# Patient Record
Sex: Female | Born: 2000 | Race: Black or African American | Hispanic: No | Marital: Single | Smoking: Former smoker
Health system: Southern US, Community
[De-identification: ages and names within clinical notes are randomized; demographics above are authoritative.]

## PROBLEM LIST (undated history)

## (undated) ENCOUNTER — Inpatient Hospital Stay (HOSPITAL_COMMUNITY): Payer: Self-pay

## (undated) DIAGNOSIS — D649 Anemia, unspecified: Secondary | ICD-10-CM

## (undated) DIAGNOSIS — A549 Gonococcal infection, unspecified: Secondary | ICD-10-CM

## (undated) HISTORY — DX: Anemia, unspecified: D64.9

## (undated) HISTORY — PX: NO PAST SURGERIES: SHX2092

## (undated) HISTORY — DX: Gonococcal infection, unspecified: A54.9

---
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
## 2019-07-27 ENCOUNTER — Ambulatory Visit (HOSPITAL_COMMUNITY)
Admission: EM | Admit: 2019-07-27 | Discharge: 2019-07-27 | Disposition: A | Discharge status: Home / Self Care | Payer: Medicaid Other | Attending: Family Medicine | Admitting: Family Medicine

## 2019-07-27 ENCOUNTER — Ambulatory Visit (INDEPENDENT_AMBULATORY_CARE_PROVIDER_SITE_OTHER): Payer: Medicaid Other

## 2019-07-27 ENCOUNTER — Encounter (HOSPITAL_COMMUNITY): Payer: Self-pay | Admitting: Emergency Medicine

## 2019-07-27 ENCOUNTER — Other Ambulatory Visit: Payer: Self-pay

## 2019-07-27 DIAGNOSIS — M79644 Pain in right finger(s): Secondary | ICD-10-CM | POA: Diagnosis not present

## 2019-07-27 DIAGNOSIS — S92811A Other fracture of right foot, initial encounter for closed fracture: Secondary | ICD-10-CM

## 2019-07-27 DIAGNOSIS — M7989 Other specified soft tissue disorders: Secondary | ICD-10-CM | POA: Diagnosis not present

## 2019-07-27 DIAGNOSIS — M79671 Pain in right foot: Secondary | ICD-10-CM

## 2019-07-27 MED ORDER — CYCLOBENZAPRINE HCL 10 MG PO TABS: 10.0000 mg | ORAL_TABLET | Freq: Two times a day (BID) | ORAL | 0 refills | Status: DC | PRN

## 2019-07-27 MED ORDER — IBUPROFEN 600 MG PO TABS: 600.0000 mg | ORAL_TABLET | Freq: Four times a day (QID) | ORAL | 0 refills | Status: DC | PRN
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
## 2019-07-27 NOTE — Discharge Instructions (Addendum)
The x-ray of your thumb shows that no bones are broken, no bony abnormalities are noted.  The x-ray of your foot shows a possible fracture to the bottom of your foot just under your big toe.  I have gotten crutches and a postop shoe for you to wear.  I would like you to follow-up with orthopedics this week.  [facility] is listed on this paperwork for you to call or to go in for an appointment.  You may use ice, ibuprofen, and elevate your foot through tonight.  Follow-up in the ER for loss of sensation, loss of strength in the right leg, other concerning symptoms.
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
## 2019-07-29 DIAGNOSIS — M79674 Pain in right toe(s): Secondary | ICD-10-CM | POA: Insufficient documentation

## 2019-10-28 DIAGNOSIS — S93529A Sprain of metatarsophalangeal joint of unspecified toe(s), initial encounter: Secondary | ICD-10-CM | POA: Insufficient documentation

## 2019-10-28 HISTORY — DX: Sprain of metatarsophalangeal joint of unspecified toe(s), initial encounter: S93.529A

## 2019-10-31 ENCOUNTER — Other Ambulatory Visit (HOSPITAL_COMMUNITY): Payer: Self-pay | Admitting: Orthopedic Surgery

## 2019-11-03 ENCOUNTER — Encounter (HOSPITAL_BASED_OUTPATIENT_CLINIC_OR_DEPARTMENT_OTHER): Payer: Self-pay | Admitting: Orthopedic Surgery

## 2019-11-03 ENCOUNTER — Other Ambulatory Visit: Payer: Self-pay

## 2019-11-07 ENCOUNTER — Other Ambulatory Visit (HOSPITAL_COMMUNITY)
Admission: RE | Admit: 2019-11-07 | Discharge: 2019-11-07 | Disposition: A | Discharge status: Home / Self Care | Payer: Medicaid Other | Source: Ambulatory Visit | Attending: Orthopedic Surgery | Admitting: Orthopedic Surgery

## 2019-11-07 DIAGNOSIS — Z20822 Contact with and (suspected) exposure to covid-19: Secondary | ICD-10-CM | POA: Insufficient documentation

## 2019-11-07 DIAGNOSIS — Z01812 Encounter for preprocedural laboratory examination: Secondary | ICD-10-CM | POA: Diagnosis present

## 2019-11-07 LAB — SARS CORONAVIRUS 2 (TAT 6-24 HRS): SARS Coronavirus 2: NEGATIVE

## 2019-11-09 NOTE — Progress Notes (Signed)

## 2019-11-10 ENCOUNTER — Ambulatory Visit (HOSPITAL_BASED_OUTPATIENT_CLINIC_OR_DEPARTMENT_OTHER): Payer: Medicaid Other | Admitting: Anesthesiology

## 2019-11-10 ENCOUNTER — Encounter (HOSPITAL_BASED_OUTPATIENT_CLINIC_OR_DEPARTMENT_OTHER): Payer: Self-pay | Admitting: Orthopedic Surgery

## 2019-11-10 ENCOUNTER — Encounter (HOSPITAL_BASED_OUTPATIENT_CLINIC_OR_DEPARTMENT_OTHER)
Admission: RE | Disposition: A | Discharge status: Home / Self Care | Payer: Self-pay | Source: Home / Self Care | Attending: Orthopedic Surgery

## 2019-11-10 ENCOUNTER — Ambulatory Visit (HOSPITAL_BASED_OUTPATIENT_CLINIC_OR_DEPARTMENT_OTHER)
Admission: RE | Admit: 2019-11-10 | Discharge: 2019-11-10 | Disposition: A | Discharge status: Home / Self Care | Payer: Medicaid Other | Attending: Orthopedic Surgery | Admitting: Orthopedic Surgery

## 2019-11-10 ENCOUNTER — Other Ambulatory Visit: Payer: Self-pay

## 2019-11-10 DIAGNOSIS — F17210 Nicotine dependence, cigarettes, uncomplicated: Secondary | ICD-10-CM | POA: Diagnosis not present

## 2019-11-10 DIAGNOSIS — G8929 Other chronic pain: Secondary | ICD-10-CM | POA: Insufficient documentation

## 2019-11-10 DIAGNOSIS — S99921A Unspecified injury of right foot, initial encounter: Secondary | ICD-10-CM | POA: Insufficient documentation

## 2019-11-10 HISTORY — PX: SESAMOIDECTOMY: SHX6418

## 2019-11-10 LAB — POCT PREGNANCY, URINE: Preg Test, Ur: NEGATIVE

## 2019-11-10 SURGERY — EXCISION, SESAMOID BONE
Anesthesia: Regional | Site: Foot | Laterality: Right

## 2019-11-10 MED ORDER — DEXAMETHASONE SODIUM PHOSPHATE 10 MG/ML IJ SOLN
INTRAMUSCULAR | Status: DC | PRN
  Administered 2019-11-10: 5 mg via INTRAVENOUS

## 2019-11-10 MED ORDER — SODIUM CHLORIDE 0.9 % IV SOLN: INTRAVENOUS | Status: DC

## 2019-11-10 MED ORDER — CEFAZOLIN SODIUM-DEXTROSE 2-4 GM/100ML-% IV SOLN
INTRAVENOUS | Status: AC
  Filled 2019-11-10: qty 100

## 2019-11-10 MED ORDER — PROPOFOL 10 MG/ML IV BOLUS
INTRAVENOUS | Status: DC | PRN
  Administered 2019-11-10: 150 mg via INTRAVENOUS

## 2019-11-10 MED ORDER — FENTANYL CITRATE (PF) 100 MCG/2ML IJ SOLN
100.0000 ug | Freq: Once | INTRAMUSCULAR | Status: AC
  Administered 2019-11-10: 50 ug via INTRAVENOUS

## 2019-11-10 MED ORDER — ACETAMINOPHEN 10 MG/ML IV SOLN: 1000.0000 mg | Freq: Once | INTRAVENOUS | Status: DC | PRN

## 2019-11-10 MED ORDER — OXYCODONE HCL 5 MG PO TABS: 5.0000 mg | ORAL_TABLET | Freq: Once | ORAL | Status: DC | PRN

## 2019-11-10 MED ORDER — MIDAZOLAM HCL 2 MG/2ML IJ SOLN
2.0000 mg | Freq: Once | INTRAMUSCULAR | Status: AC
  Administered 2019-11-10: 2 mg via INTRAVENOUS

## 2019-11-10 MED ORDER — FENTANYL CITRATE (PF) 100 MCG/2ML IJ SOLN
INTRAMUSCULAR | Status: AC
  Filled 2019-11-10: qty 2

## 2019-11-10 MED ORDER — ONDANSETRON HCL 4 MG/2ML IJ SOLN
INTRAMUSCULAR | Status: AC
  Filled 2019-11-10: qty 2

## 2019-11-10 MED ORDER — PHENYLEPHRINE 40 MCG/ML (10ML) SYRINGE FOR IV PUSH (FOR BLOOD PRESSURE SUPPORT)
PREFILLED_SYRINGE | INTRAVENOUS | Status: AC
  Filled 2019-11-10: qty 10

## 2019-11-10 MED ORDER — LACTATED RINGERS IV SOLN: INTRAVENOUS | Status: DC

## 2019-11-10 MED ORDER — ACETAMINOPHEN 160 MG/5ML PO SOLN: 1000.0000 mg | Freq: Once | ORAL | Status: DC | PRN

## 2019-11-10 MED ORDER — DIPHENHYDRAMINE HCL 50 MG/ML IJ SOLN
INTRAMUSCULAR | Status: AC
  Filled 2019-11-10: qty 1

## 2019-11-10 MED ORDER — FENTANYL CITRATE (PF) 100 MCG/2ML IJ SOLN
INTRAMUSCULAR | Status: DC | PRN
  Administered 2019-11-10: 50 ug via INTRAVENOUS
  Administered 2019-11-10 (×2): 25 ug via INTRAVENOUS

## 2019-11-10 MED ORDER — MIDAZOLAM HCL 2 MG/2ML IJ SOLN
INTRAMUSCULAR | Status: AC
  Filled 2019-11-10: qty 2

## 2019-11-10 MED ORDER — MIDAZOLAM HCL 5 MG/5ML IJ SOLN
INTRAMUSCULAR | Status: DC | PRN
  Administered 2019-11-10: 1 mg via INTRAVENOUS

## 2019-11-10 MED ORDER — LIDOCAINE 2% (20 MG/ML) 5 ML SYRINGE
INTRAMUSCULAR | Status: AC
  Filled 2019-11-10: qty 5

## 2019-11-10 MED ORDER — OXYCODONE HCL 5 MG/5ML PO SOLN: 5.0000 mg | Freq: Once | ORAL | Status: DC | PRN

## 2019-11-10 MED ORDER — DIPHENHYDRAMINE HCL 50 MG/ML IJ SOLN
INTRAMUSCULAR | Status: DC | PRN
  Administered 2019-11-10: 6.25 mg via INTRAVENOUS

## 2019-11-10 MED ORDER — DEXAMETHASONE SODIUM PHOSPHATE 10 MG/ML IJ SOLN
INTRAMUSCULAR | Status: AC
  Filled 2019-11-10: qty 1

## 2019-11-10 MED ORDER — FENTANYL CITRATE (PF) 100 MCG/2ML IJ SOLN: 25.0000 ug | INTRAMUSCULAR | Status: DC | PRN

## 2019-11-10 MED ORDER — ACETAMINOPHEN 500 MG PO TABS: 1000.0000 mg | ORAL_TABLET | Freq: Once | ORAL | Status: DC | PRN

## 2019-11-10 MED ORDER — ONDANSETRON HCL 4 MG/2ML IJ SOLN
INTRAMUSCULAR | Status: DC | PRN
  Administered 2019-11-10: 4 mg via INTRAVENOUS

## 2019-11-10 MED ORDER — CEFAZOLIN SODIUM-DEXTROSE 2-4 GM/100ML-% IV SOLN
2.0000 g | INTRAVENOUS | Status: AC
  Administered 2019-11-10: 2 g via INTRAVENOUS

## 2019-11-10 MED ORDER — SUCCINYLCHOLINE CHLORIDE 200 MG/10ML IV SOSY
PREFILLED_SYRINGE | INTRAVENOUS | Status: AC
  Filled 2019-11-10: qty 10

## 2019-11-10 MED ORDER — BUPIVACAINE-EPINEPHRINE (PF) 0.5% -1:200000 IJ SOLN
INTRAMUSCULAR | Status: DC | PRN
Start: 2019-11-10 — End: 2019-11-10
  Administered 2019-11-10: 30 mL via PERINEURAL

## 2019-11-10 MED ORDER — LIDOCAINE HCL (CARDIAC) PF 100 MG/5ML IV SOSY
PREFILLED_SYRINGE | INTRAVENOUS | Status: DC | PRN
  Administered 2019-11-10: 40 mg via INTRAVENOUS

## 2019-11-10 MED ORDER — EPHEDRINE 5 MG/ML INJ
INTRAVENOUS | Status: AC
  Filled 2019-11-10: qty 10

## 2019-11-10 MED ORDER — 0.9 % SODIUM CHLORIDE (POUR BTL) OPTIME
TOPICAL | Status: DC | PRN
  Administered 2019-11-10: 400 mL

## 2019-11-10 MED ORDER — OXYCODONE HCL 5 MG PO TABS
5.0000 mg | ORAL_TABLET | Freq: Four times a day (QID) | ORAL | 0 refills | Status: AC | PRN
Start: 2019-11-10 — End: 2019-11-15

## 2019-11-10 SURGICAL SUPPLY — 69 items
ANCH SUT 2-0 3/8 CRC MIC FT 4 (Anchor) ×2 IMPLANT
ANCHOR FT CORKSCREW MICRO 2-0 (Anchor) ×4 IMPLANT
APL PRP STRL LF DISP 70% ISPRP (MISCELLANEOUS) ×1
BANDAGE ESMARK 6X9 LF (GAUZE/BANDAGES/DRESSINGS) ×1 IMPLANT
BLADE MINI RND TIP GREEN BEAV (BLADE) ×1 IMPLANT
BLADE SURG 15 STRL LF DISP TIS (BLADE) ×2 IMPLANT
BLADE SURG 15 STRL SS (BLADE) ×6
BNDG CMPR 9X4 STRL LF SNTH (GAUZE/BANDAGES/DRESSINGS)
BNDG CMPR 9X6 STRL LF SNTH (GAUZE/BANDAGES/DRESSINGS)
BNDG COHESIVE 4X5 TAN STRL (GAUZE/BANDAGES/DRESSINGS) ×2 IMPLANT
BNDG COHESIVE 6X5 TAN STRL LF (GAUZE/BANDAGES/DRESSINGS) ×2 IMPLANT
BNDG CONFORM 3 STRL LF (GAUZE/BANDAGES/DRESSINGS) ×2 IMPLANT
BNDG ELASTIC 4X5.8 VLCR STR LF (GAUZE/BANDAGES/DRESSINGS) ×2 IMPLANT
BNDG ESMARK 4X9 LF (GAUZE/BANDAGES/DRESSINGS) IMPLANT
BNDG ESMARK 6X9 LF (GAUZE/BANDAGES/DRESSINGS)
CHLORAPREP W/TINT 26 (MISCELLANEOUS) ×3 IMPLANT
CORD BIPOLAR FORCEPS 12FT (ELECTRODE) IMPLANT
COVER BACK TABLE 60X90IN (DRAPES) ×3 IMPLANT
COVER WAND RF STERILE (DRAPES) IMPLANT
CUFF TOURN SGL QUICK 24 (TOURNIQUET CUFF)
CUFF TOURN SGL QUICK 34 (TOURNIQUET CUFF) ×3
CUFF TRNQT CYL 24X4X16.5-23 (TOURNIQUET CUFF) IMPLANT
CUFF TRNQT CYL 34X4.125X (TOURNIQUET CUFF) IMPLANT
DRAPE EXTREMITY T 121X128X90 (DISPOSABLE) ×3 IMPLANT
DRAPE OEC MINIVIEW 54X84 (DRAPES) IMPLANT
DRAPE U-SHAPE 47X51 STRL (DRAPES) ×3 IMPLANT
DRSG MEPITEL 4X7.2 (GAUZE/BANDAGES/DRESSINGS) ×3 IMPLANT
DRSG PAD ABDOMINAL 8X10 ST (GAUZE/BANDAGES/DRESSINGS) ×3 IMPLANT
ELECT REM PT RETURN 9FT ADLT (ELECTROSURGICAL) ×3
ELECTRODE REM PT RTRN 9FT ADLT (ELECTROSURGICAL) IMPLANT
GAUZE SPONGE 4X4 12PLY STRL (GAUZE/BANDAGES/DRESSINGS) ×3 IMPLANT
GLOVE BIO SURGEON STRL SZ8 (GLOVE) ×3 IMPLANT
GLOVE BIOGEL PI IND STRL 7.0 (GLOVE) IMPLANT
GLOVE BIOGEL PI IND STRL 8 (GLOVE) ×2 IMPLANT
GLOVE BIOGEL PI INDICATOR 7.0 (GLOVE) ×4
GLOVE BIOGEL PI INDICATOR 8 (GLOVE) ×4
GLOVE ECLIPSE 6.5 STRL STRAW (GLOVE) ×2 IMPLANT
GLOVE ECLIPSE 8.0 STRL XLNG CF (GLOVE) ×3 IMPLANT
GOWN STRL REUS W/ TWL LRG LVL3 (GOWN DISPOSABLE) ×1 IMPLANT
GOWN STRL REUS W/ TWL XL LVL3 (GOWN DISPOSABLE) ×2 IMPLANT
GOWN STRL REUS W/TWL LRG LVL3 (GOWN DISPOSABLE) ×3
GOWN STRL REUS W/TWL XL LVL3 (GOWN DISPOSABLE) ×3
LOOP VESSEL MAXI BLUE (MISCELLANEOUS) ×2 IMPLANT
NDL HYPO 25X1 1.5 SAFETY (NEEDLE) IMPLANT
NEEDLE HYPO 22GX1.5 SAFETY (NEEDLE) IMPLANT
NEEDLE HYPO 25X1 1.5 SAFETY (NEEDLE) IMPLANT
NS IRRIG 1000ML POUR BTL (IV SOLUTION) ×3 IMPLANT
PACK BASIN DAY SURGERY FS (CUSTOM PROCEDURE TRAY) ×3 IMPLANT
PAD CAST 4YDX4 CTTN HI CHSV (CAST SUPPLIES) ×1 IMPLANT
PADDING CAST COTTON 4X4 STRL (CAST SUPPLIES) ×3
PADDING CAST COTTON 6X4 STRL (CAST SUPPLIES) ×2 IMPLANT
PENCIL SMOKE EVACUATOR (MISCELLANEOUS) ×2 IMPLANT
SANITIZER HAND PURELL 535ML FO (MISCELLANEOUS) ×3 IMPLANT
SHEET MEDIUM DRAPE 40X70 STRL (DRAPES) ×3 IMPLANT
SLEEVE SCD COMPRESS KNEE MED (MISCELLANEOUS) ×3 IMPLANT
SPLINT FAST PLASTER 5X30 (CAST SUPPLIES) ×40
SPLINT PLASTER CAST FAST 5X30 (CAST SUPPLIES) IMPLANT
SPONGE LAP 18X18 RF (DISPOSABLE) ×3 IMPLANT
STOCKINETTE 6  STRL (DRAPES) ×3
STOCKINETTE 6 STRL (DRAPES) ×1 IMPLANT
SUT ETHILON 3 0 PS 1 (SUTURE) ×3 IMPLANT
SUT MNCRL AB 3-0 PS2 18 (SUTURE) ×3 IMPLANT
SUT VIC AB 0 SH 27 (SUTURE) ×2 IMPLANT
SUT VIC AB 2-0 SH 27 (SUTURE)
SUT VIC AB 2-0 SH 27XBRD (SUTURE) IMPLANT
SYR BULB EAR ULCER 3OZ GRN STR (SYRINGE) ×3 IMPLANT
SYR CONTROL 10ML LL (SYRINGE) IMPLANT
TOWEL GREEN STERILE FF (TOWEL DISPOSABLE) ×3 IMPLANT
UNDERPAD 30X36 HEAVY ABSORB (UNDERPADS AND DIAPERS) ×3 IMPLANT

## 2019-11-10 NOTE — Anesthesia Preprocedure Evaluation (Signed)
Anesthesia Evaluation  Patient identified by MRN, date of birth, ID band Patient awake    Reviewed: Allergy & Precautions, NPO status , Patient's Chart, lab work & pertinent test results  Airway Mallampati: I  TM Distance: >3 FB Neck ROM: Full    Dental  (+) Dental Advisory Given, Teeth Intact,    Pulmonary neg recent URI, Current Smoker and Patient abstained from smoking.,    breath sounds clear to auscultation       Cardiovascular negative cardio ROS   Rhythm:Regular     Neuro/Psych negative neurological ROS  negative psych ROS   GI/Hepatic negative GI ROS, Neg liver ROS,   Endo/Other  negative endocrine ROS  Renal/GU negative Renal ROS     Musculoskeletal Right plantar plate injury   Abdominal   Peds  Hematology negative hematology ROS (+)   Anesthesia Other Findings   Reproductive/Obstetrics                             Anesthesia Physical Anesthesia Plan  ASA: II  Anesthesia Plan: General and Regional   Post-op Pain Management:  Regional for Post-op pain   Induction: Intravenous  PONV Risk Score and Plan: 2 and Ondansetron and Dexamethasone  Airway Management Planned: LMA  Additional Equipment: None  Intra-op Plan:   Post-operative Plan: Extubation in OR  Informed Consent: I have reviewed the patients History and Physical, chart, labs and discussed the procedure including the risks, benefits and alternatives for the proposed anesthesia with the patient or authorized representative who has indicated his/her understanding and acceptance.     Dental advisory given  Plan Discussed with: CRNA and Surgeon  Anesthesia Plan Comments:         Anesthesia Quick Evaluation

## 2019-11-10 NOTE — Transfer of Care (Signed)
Immediate Anesthesia Transfer of Care Note  Patient: Stacey Woods  Procedure(s) Performed: Right plantar plate repair (Right Foot)  Patient Location: PACU  Anesthesia Type:GA combined with regional for post-op pain  Level of Consciousness: awake, alert , oriented and drowsy  Airway & Oxygen Therapy: Patient Spontanous Breathing and Patient connected to face mask oxygen  Post-op Assessment: Report given to RN and Post -op Vital signs reviewed and stable  Post vital signs: Reviewed and stable  Last Vitals:  Vitals Value Taken Time  BP    Temp    Pulse 85 11/10/19 1347  Resp 18 11/10/19 1347  SpO2 100 % 11/10/19 1347  Vitals shown include unvalidated device data.  Last Pain:  Vitals:   11/10/19 1026  TempSrc: Oral  PainSc: 0-No pain      Patients Stated Pain Goal: 3 (11/10/19 1026)  Complications: No complications documented.

## 2019-11-10 NOTE — Anesthesia Postprocedure Evaluation (Signed)
Anesthesia Post Note  Patient: Stacey Woods  Procedure(s) Performed: Right plantar plate repair (Right Foot)     Patient location during evaluation: PACU Anesthesia Type: Regional and General Level of consciousness: awake and alert Pain management: pain level controlled Vital Signs Assessment: post-procedure vital signs reviewed and stable Respiratory status: spontaneous breathing, nonlabored ventilation, respiratory function stable and patient connected to nasal cannula oxygen Cardiovascular status: blood pressure returned to baseline and stable Postop Assessment: no apparent nausea or vomiting Anesthetic complications: no   No complications documented.  Last Vitals:  Vitals:   11/10/19 1415 11/10/19 1445  BP:  (!) 132/83  Pulse: 59 57  Resp: 14 16  Temp:  36.5 C  SpO2: 100% 100%    Last Pain:  Vitals:   11/10/19 1445  TempSrc:   PainSc: 0-No pain                 Nava Song

## 2019-11-10 NOTE — Anesthesia Procedure Notes (Signed)
Procedure Name: LMA Insertion Date/Time: 11/10/2019 12:15 PM Performed by: Ronnette Hila, CRNA Pre-anesthesia Checklist: Patient identified, Emergency Drugs available, Suction available and Patient being monitored Patient Re-evaluated:Patient Re-evaluated prior to induction Oxygen Delivery Method: Circle system utilized Preoxygenation: Pre-oxygenation with 100% oxygen Induction Type: IV induction Ventilation: Mask ventilation without difficulty LMA: LMA inserted LMA Size: 4.0 Number of attempts: 1 Airway Equipment and Method: Bite block Placement Confirmation: positive ETCO2 Tube secured with: Tape Dental Injury: Teeth and Oropharynx as per pre-operative assessment
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
## 2019-11-10 NOTE — Progress Notes (Signed)
Assisted Dr. Moser with right, ultrasound guided, popliteal block. Side rails up, monitors on throughout procedure. See vital signs in flow sheet. Tolerated Procedure well. 

## 2019-11-10 NOTE — Anesthesia Procedure Notes (Signed)
Anesthesia Regional Block: Popliteal block   Pre-Anesthetic Checklist: ,, timeout performed, Correct Patient, Correct Site, Correct Laterality, Correct Procedure, Correct Position, site marked, Risks and benefits discussed,  Surgical consent,  Pre-op evaluation,  At surgeon's request and post-op pain management  Laterality: Right and Lower  Prep: chloraprep       Needles:  Injection technique: Single-shot     Needle Length: 9cm  Needle Gauge: 22     Additional Needles: Arrow StimuQuik ECHO Echogenic Stimulating PNB Needle  Procedures:,,,, ultrasound used (permanent image in chart),,,,  Narrative:  Start time: 11/10/2019 11:34 AM End time: 11/10/2019 11:39 AM Injection made incrementally with aspirations every 5 mL.  Performed by: Personally  Anesthesiologist: Val Eagle, MD

## 2019-11-10 NOTE — Discharge Instructions (Addendum)
Toni Arthurs, MD [facility]  Please read the following information regarding your care after surgery.  Medications  You only need a prescription for the narcotic pain medicine (ex. oxycodone, Percocet, Norco).  All of the other medicines listed below are available over the counter. X Aleve 2 pills twice a day for the first 3 days after surgery. X acetominophen (Tylenol) 650 mg every 4-6 hours as you need for minor to moderate pain X oxycodone as prescribed for severe pain  Narcotic pain medicine (ex. oxycodone, Percocet, Vicodin) will cause constipation.  To prevent this problem, take the following medicines while you are taking any pain medicine. X docusate sodium (Colace) 100 mg twice a day x senna (Senokot) 2 tablets twice a day  X To help prevent blood clots, take a baby aspirin (81 mg) twice a day for two weeks after surgery.  You should also get up every hour while you are awake to move around.    Weight Bearing ? Bear weight when you are able on your operated leg or foot. ? Bear weight only on your operated foot in the post-op shoe. X Do not bear any weight on the operated leg or foot.  Cast / Splint / Dressing X Keep your splint, cast or dressing clean and dry.  Don't put anything (coat hanger, pencil, etc) down inside of it.  If it gets damp, use a hair dryer on the cool setting to dry it.  If it gets soaked, call the office to schedule an appointment for a cast change. ? Remove your dressing 3 days after surgery and cover the incisions with dry dressings.    After your dressing, cast or splint is removed; you may shower, but do not soak or scrub the wound.  Allow the water to run over it, and then gently pat it dry.  Swelling It is normal for you to have swelling where you had surgery.  To reduce swelling and pain, keep your toes above your nose for at least 3 days after surgery.  It may be necessary to keep your foot or leg elevated for several weeks.  If it hurts, it should be  elevated.  Follow Up Call my office at [phone] when you are discharged from the hospital or surgery center to schedule an appointment to be seen two weeks after surgery.  Call my office at [phone] if you develop a fever >101.5 F, nausea, vomiting, bleeding from the surgical site or severe pain.     Post Anesthesia Home Care Instructions  Activity: Get plenty of rest for the remainder of the day. A responsible individual must stay with you for 24 hours following the procedure.  For the next 24 hours, DO NOT: -Drive a car -Advertising copywriter -Drink alcoholic beverages -Take any medication unless instructed by your physician -Make any legal decisions or sign important papers.  Meals: Start with liquid foods such as gelatin or soup. Progress to regular foods as tolerated. Avoid greasy, spicy, heavy foods. If nausea and/or vomiting occur, drink only clear liquids until the nausea and/or vomiting subsides. Call your physician if vomiting continues.  Special Instructions/Symptoms: Your throat may feel dry or sore from the anesthesia or the breathing tube placed in your throat during surgery. If this causes discomfort, gargle with warm salt water. The discomfort should disappear within 24 hours.  If you had a scopolamine patch placed behind your ear for the management of post- operative nausea and/or vomiting:  1. The medication in the patch is  effective for 72 hours, after which it should be removed.  Wrap patch in a tissue and discard in the trash. Wash hands thoroughly with soap and water. 2. You may remove the patch earlier than 72 hours if you experience unpleasant side effects which may include dry mouth, dizziness or visual disturbances. 3. Avoid touching the patch. Wash your hands with soap and water after contact with the patch.    Regional Anesthesia Blocks  1. Numbness or the inability to move the "blocked" extremity may last from 3-48 hours after placement. The length  of time depends on the medication injected and your individual response to the medication. If the numbness is not going away after 48 hours, call your surgeon.  2. The extremity that is blocked will need to be protected until the numbness is gone and the  Strength has returned. Because you cannot feel it, you will need to take extra care to avoid injury. Because it may be weak, you may have difficulty moving it or using it. You may not know what position it is in without looking at it while the block is in effect.  3. For blocks in the legs and feet, returning to weight bearing and walking needs to be done carefully. You will need to wait until the numbness is entirely gone and the strength has returned. You should be able to move your leg and foot normally before you try and bear weight or walk. You will need someone to be with you when you first try to ensure you do not fall and possibly risk injury.  4. Bruising and tenderness at the needle site are common side effects and will resolve in a few days.  5. Persistent numbness or new problems with movement should be communicated to the surgeon or the [facility] [phone] [facility] [phone]).
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
## 2019-11-15 ENCOUNTER — Encounter (HOSPITAL_BASED_OUTPATIENT_CLINIC_OR_DEPARTMENT_OTHER): Payer: Self-pay | Admitting: Orthopedic Surgery

## 2019-12-07 ENCOUNTER — Other Ambulatory Visit: Payer: Self-pay

## 2019-12-07 ENCOUNTER — Ambulatory Visit
Admission: EM | Admit: 2019-12-07 | Discharge: 2019-12-07 | Disposition: A | Discharge status: Home / Self Care | Payer: Medicaid Other | Attending: Physician Assistant | Admitting: Physician Assistant

## 2019-12-07 DIAGNOSIS — Z202 Contact with and (suspected) exposure to infections with a predominantly sexual mode of transmission: Secondary | ICD-10-CM | POA: Diagnosis present

## 2019-12-07 DIAGNOSIS — N3090 Cystitis, unspecified without hematuria: Secondary | ICD-10-CM | POA: Insufficient documentation

## 2019-12-07 LAB — POCT URINALYSIS DIP (MANUAL ENTRY)
Bilirubin, UA: NEGATIVE
Glucose, UA: NEGATIVE mg/dL
Ketones, POC UA: NEGATIVE mg/dL
Nitrite, UA: NEGATIVE
Protein Ur, POC: NEGATIVE mg/dL
Spec Grav, UA: 1.02 (ref 1.010–1.025)
Urobilinogen, UA: 0.2 E.U./dL
pH, UA: 6.5 (ref 5.0–8.0)

## 2019-12-07 LAB — POCT URINE PREGNANCY: Preg Test, Ur: NEGATIVE

## 2019-12-07 MED ORDER — CEPHALEXIN 500 MG PO CAPS
500.0000 mg | ORAL_CAPSULE | Freq: Two times a day (BID) | ORAL | 0 refills | Status: DC
Start: 2019-12-07 — End: 2020-08-19
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
## 2019-12-07 NOTE — Discharge Instructions (Signed)
Your urine was positive for an urinary tract infection. Start keflex as directed. Cytology sent, you will be contacted with any positive results that requires further treatment. Refrain from sexual activity until testing results return. Monitor for any worsening of symptoms, fever, abdominal pain, nausea, vomiting, go to the emergency department for further evaluation.

## 2019-12-07 NOTE — ED Provider Notes (Signed)
[facility]    CSN: [id number] Arrival date & time: 12/07/19  1254      History   Chief Complaint Chief Complaint  Patient presents with  . Dysuria  . Exposure to STD    HPI Stacey Woods is a 19 y.o. female.   19 year old female comes in for 2 day history of urinary frequency, urgency. Denies dysuria, hematuria.  Denies vaginal discharge, itching, spotting. Denies fever, chills, body aches. Denies abdominal pain, nausea, vomiting. LMP 11/25/2019. Sexually active one female partner. States was told to "get checked "for STD", but unsure actual exposure.      History reviewed. No pertinent past medical history.  There are no problems to display for this patient.   Past Surgical History:  Procedure Laterality Date  . NO PAST SURGERIES    . SESAMOIDECTOMY Right 11/10/2019   Procedure: Right plantar plate repair;  Surgeon: Toni Arthurs, MD;  Location: [facility];  Service: Orthopedics;  Laterality: Right;    OB History   No obstetric history on file.      Home Medications    Prior to Admission medications   Medication Sig Start Date End Date Taking? Authorizing Provider  aspirin 81 MG chewable tablet Chew by mouth daily.   Yes [provider]  HYDROcodone-acetaminophen (NORCO/VICODIN) 5-325 MG tablet hydrocodone 5 mg-acetaminophen 325 mg tablet  TAKE 1 TABLET BY MOUTH EVERY 4 HOURS AS NEEDED FOR SEVERE PAIN   Yes [provider]  cephALEXin (KEFLEX) 500 MG capsule Take 1 capsule (500 mg total) by mouth 2 (two) times daily. 12/07/19   Belinda Fisher, PA-C    Family History History reviewed. No pertinent family history.  Social History Social History   Tobacco Use  . Smoking status: Current Some Day Smoker    Types: Cigarettes  . Smokeless tobacco: Never Used  . Tobacco comment: stopped smoking a week ago   Substance Use Topics  . Alcohol use: Never  . Drug use: Never     Allergies   Patient has no known  allergies.   Review of Systems Review of Systems  Reason unable to perform ROS: See HPI as above.     Physical Exam Triage Vital Signs ED Triage Vitals  Enc Vitals Group     BP 12/07/19 1447 114/79     Pulse Rate 12/07/19 1447 93     Resp 12/07/19 1447 16     Temp 12/07/19 1447 98.7 F (37.1 C)     Temp Source 12/07/19 1447 Oral     SpO2 12/07/19 1447 98 %     Weight --      Height --      Head Circumference --      Peak Flow --      Pain Score 12/07/19 1501 0     Pain Loc --      Pain Edu? --      Excl. in GC? --    No data found.  Updated Vital Signs BP 114/79 (BP Location: Left Arm)   Pulse 93   Temp 98.7 F (37.1 C) (Oral)   Resp 16   LMP 11/25/2019   SpO2 98%   Physical Exam Constitutional:      General: She is not in acute distress.    Appearance: She is well-developed. She is not ill-appearing, toxic-appearing or diaphoretic.  HENT:     Head: Normocephalic and atraumatic.  Eyes:     Conjunctiva/sclera: Conjunctivae normal.  Pupils: Pupils are equal, round, and reactive to light.  Cardiovascular:     Rate and Rhythm: Normal rate and regular rhythm.  Pulmonary:     Effort: Pulmonary effort is normal. No respiratory distress.     Comments: LCTAB Abdominal:     General: Bowel sounds are normal.     Palpations: Abdomen is soft.     Tenderness: There is no abdominal tenderness. There is no right CVA tenderness, left CVA tenderness, guarding or rebound.  Musculoskeletal:     Cervical back: Normal range of motion and neck supple.  Skin:    General: Skin is warm and dry.  Neurological:     Mental Status: She is alert and oriented to person, place, and time.  Psychiatric:        Behavior: Behavior normal.        Judgment: Judgment normal.      UC Treatments / Results  Labs (all labs ordered are listed, but only abnormal results are displayed) Labs Reviewed  POCT URINALYSIS DIP (MANUAL ENTRY) - Abnormal; Notable for the following components:       Result Value   Blood, UA small (*)    Leukocytes, UA Small (1+) (*)    All other components within normal limits  URINE CULTURE  POCT URINE PREGNANCY  CERVICOVAGINAL ANCILLARY ONLY    EKG   Radiology No results found.  Procedures Procedures (including critical care time)  Medications Ordered in UC Medications - No data to display  Initial Impression / Assessment and Plan / UC Course  I have reviewed the triage vital signs and the nursing notes.  Pertinent labs & imaging results that were available during my care of the patient were reviewed by me and considered in my medical decision making (see chart for details).    Urine dipstick positive for UTI. Start keflex as directed. Cytology sent, patient to refrain from sexual activity until testing results return. Push fluids. Return precautions given.  Final Clinical Impressions(s) / UC Diagnoses   Final diagnoses:  Cystitis  Potential exposure to STD   ED Prescriptions    Medication Sig Dispense Auth. Provider   cephALEXin (KEFLEX) 500 MG capsule Take 1 capsule (500 mg total) by mouth 2 (two) times daily. 10 capsule Belinda Fisher, PA-C     PDMP not reviewed this encounter.   Belinda Fisher, PA-C 12/07/19 1524

## 2019-12-09 LAB — CERVICOVAGINAL ANCILLARY ONLY
Bacterial Vaginitis (gardnerella): POSITIVE — AB
Candida Glabrata: NEGATIVE
Candida Vaginitis: NEGATIVE
Chlamydia: NEGATIVE
Comment: NEGATIVE
Comment: NEGATIVE
Comment: NEGATIVE
Comment: NEGATIVE
Comment: NEGATIVE
Comment: NORMAL
Neisseria Gonorrhea: POSITIVE — AB
Trichomonas: POSITIVE — AB

## 2019-12-10 ENCOUNTER — Other Ambulatory Visit: Payer: Self-pay

## 2019-12-10 ENCOUNTER — Encounter: Payer: Self-pay | Admitting: Emergency Medicine

## 2019-12-10 ENCOUNTER — Telehealth: Payer: Self-pay | Admitting: Emergency Medicine

## 2019-12-10 ENCOUNTER — Ambulatory Visit
Admission: EM | Admit: 2019-12-10 | Discharge: 2019-12-10 | Disposition: A | Discharge status: Home / Self Care | Payer: Medicaid Other | Attending: Emergency Medicine | Admitting: Emergency Medicine

## 2019-12-10 DIAGNOSIS — A549 Gonococcal infection, unspecified: Secondary | ICD-10-CM | POA: Diagnosis not present

## 2019-12-10 LAB — URINE CULTURE: Culture: >=100000 — AB

## 2019-12-10 MED ORDER — CEFTRIAXONE SODIUM 500 MG IJ SOLR
500.0000 mg | Freq: Once | INTRAMUSCULAR | Status: AC
  Administered 2019-12-10: 500 mg via INTRAMUSCULAR

## 2019-12-10 MED ORDER — METRONIDAZOLE 500 MG PO TABS
500.0000 mg | ORAL_TABLET | Freq: Two times a day (BID) | ORAL | 0 refills | Status: DC
Start: 2019-12-10 — End: 2020-08-19

## 2019-12-10 MED ORDER — FLUCONAZOLE 150 MG PO TABS
150.0000 mg | ORAL_TABLET | Freq: Every day | ORAL | 0 refills | Status: DC
Start: 2019-12-10 — End: 2020-08-19

## 2019-12-10 NOTE — ED Triage Notes (Signed)
Pt here for STD treatment; pt given RX for BV and trichomonas and given IM rocephin for [facility]

## 2020-04-12 ENCOUNTER — Emergency Department (HOSPITAL_COMMUNITY)
Admission: EM | Admit: 2020-04-12 | Discharge: 2020-04-12 | Disposition: A | Discharge status: Home / Self Care | Payer: Medicaid Other | Attending: Student | Admitting: Student

## 2020-04-12 ENCOUNTER — Encounter (HOSPITAL_COMMUNITY): Payer: Self-pay | Admitting: Emergency Medicine

## 2020-04-12 DIAGNOSIS — F17210 Nicotine dependence, cigarettes, uncomplicated: Secondary | ICD-10-CM | POA: Diagnosis not present

## 2020-04-12 DIAGNOSIS — Z7982 Long term (current) use of aspirin: Secondary | ICD-10-CM | POA: Diagnosis not present

## 2020-04-12 DIAGNOSIS — Z20822 Contact with and (suspected) exposure to covid-19: Secondary | ICD-10-CM | POA: Diagnosis present

## 2020-04-12 LAB — RESP PANEL BY RT-PCR (FLU A&B, COVID) ARPGX2
Influenza A by PCR: NEGATIVE
Influenza B by PCR: NEGATIVE
SARS Coronavirus 2 by RT PCR: NEGATIVE

## 2020-04-12 NOTE — ED Provider Notes (Signed)
[facility] EMERGENCY DEPARTMENT Provider Note   CSN: [id number] Arrival date & time: 04/12/20  2876     History Chief Complaint  Patient presents with  . Covid Exposure    Stacey Woods is a 19 y.o. female with no significant past medical history presents to the ED requesting a Covid test.  Patient states she had a positive Covid exposure 2 days ago.  Denies any symptoms.  Denies chest pain, shortness of breath, fever, chills, sore throat, abdominal pain, nausea, vomiting, diarrhea.  No treatment prior to arrival.  No aggravating or alleviating factors.  History obtained from patient and past medical records. No interpreter used during encounter.      History reviewed. No pertinent past medical history.  There are no problems to display for this patient.   Past Surgical History:  Procedure Laterality Date  . NO PAST SURGERIES    . SESAMOIDECTOMY Right 11/10/2019   Procedure: Right plantar plate repair;  Surgeon: Toni Arthurs, MD;  Location: [facility];  Service: Orthopedics;  Laterality: Right;     OB History   No obstetric history on file.     No family history on file.  Social History   Tobacco Use  . Smoking status: Current Some Day Smoker    Types: Cigarettes  . Smokeless tobacco: Never Used  . Tobacco comment: stopped smoking a week ago   Substance Use Topics  . Alcohol use: Never  . Drug use: Never    Home Medications Prior to Admission medications   Medication Sig Start Date End Date Taking? Authorizing Provider  aspirin 81 MG chewable tablet Chew by mouth daily.    [provider]  cephALEXin (KEFLEX) 500 MG capsule Take 1 capsule (500 mg total) by mouth 2 (two) times daily. 12/07/19   Cathie Hoops, Amy V, PA-C  fluconazole (DIFLUCAN) 150 MG tablet Take 1 tablet (150 mg total) by mouth daily. 12/10/19   Hall-Potvin, [place], PA-C  HYDROcodone-acetaminophen (NORCO/VICODIN) 5-325 MG tablet hydrocodone 5 mg-acetaminophen  325 mg tablet  TAKE 1 TABLET BY MOUTH EVERY 4 HOURS AS NEEDED FOR SEVERE PAIN    [provider]  metroNIDAZOLE (FLAGYL) 500 MG tablet Take 1 tablet (500 mg total) by mouth 2 (two) times daily. 12/10/19   Hall-Potvin, [place], PA-C    Allergies    Patient has no known allergies.  Review of Systems   Review of Systems  Constitutional: Negative for chills and fever.  HENT: Negative for sore throat.   Respiratory: Negative for shortness of breath.   Cardiovascular: Negative for chest pain.  Gastrointestinal: Negative for diarrhea, nausea and vomiting.  All other systems reviewed and are negative.   Physical Exam Updated Vital Signs BP 119/73   Pulse 79   Temp 98.1 F (36.7 C) (Oral)   Resp 16   Ht 5\' 4"  (1.626 m)   SpO2 100%   BMI 24.90 kg/m   Physical Exam Vitals and nursing note reviewed.  Constitutional:      General: She is not in acute distress.    Appearance: She is not ill-appearing.  HENT:     Head: Normocephalic.  Eyes:     Pupils: Pupils are equal, round, and reactive to light.  Cardiovascular:     Rate and Rhythm: Normal rate and regular rhythm.     Pulses: Normal pulses.     Heart sounds: Normal heart sounds. No murmur heard. No friction rub. No gallop.   Pulmonary:  Effort: Pulmonary effort is normal.     Breath sounds: Normal breath sounds.     Comments: Respirations equal and unlabored, patient able to speak in full sentences, lungs clear to auscultation bilaterally Abdominal:     General: Abdomen is flat. There is no distension.     Palpations: Abdomen is soft.     Tenderness: There is no abdominal tenderness. There is no guarding or rebound.  Musculoskeletal:        General: Normal range of motion.     Cervical back: Neck supple.  Skin:    General: Skin is warm and dry.  Neurological:     General: No focal deficit present.     Mental Status: She is alert.  Psychiatric:        Mood and Affect: Mood normal.        Behavior:  Behavior normal.     ED Results / Procedures / Treatments   Labs (all labs ordered are listed, but only abnormal results are displayed) Labs Reviewed  RESP PANEL BY RT-PCR (FLU A&B, COVID) ARPGX2    EKG None  Radiology No results found.  Procedures Procedures (including critical care time)  Medications Ordered in ED Medications - No data to display  ED Course  I have reviewed the triage vital signs and the nursing notes.  Pertinent labs & imaging results that were available during my care of the patient were reviewed by me and considered in my medical decision making (see chart for details).    MDM Rules/Calculators/A&P                         19 year old female presents to the ED requesting a Covid test after a positive exposure 2 days ago.  Patient is currently asymptomatic.  Vitals all within normal limits.  Lungs clear to auscultation bilaterally.  Low suspicion for pneumonia.  Covid test pending.  Self quarantine guidelines discussed with patient. Strict ED precautions discussed with patient. Patient states understanding and agrees to plan. Patient discharged home in no acute distress and stable vitals  Final Clinical Impression(s) / ED Diagnoses Final diagnoses:  Exposure to COVID-19 virus    Rx / DC Orders ED Discharge Orders    None       Jesusita Oka 04/12/20 1133    Alvira Monday, MD 04/12/20 [phone]

## 2020-04-12 NOTE — ED Triage Notes (Signed)
Pt reports she was around someone 2 days ago that tested pos for covid yesterday. She denies any symptoms.

## 2020-04-12 NOTE — Discharge Instructions (Addendum)
Your Covid results will become available within the next 2 hours.  Continue to self quarantine until your results are available.  If your Covid test is positive you must self quarantine away from others for 10 days since symptom onset.  You may take over-the-counter ibuprofen or Tylenol as needed for fever and body aches.  Return to the ER for new or worsening symptoms.

## 2020-04-14 NOTE — L&D Delivery Note (Signed)
Delivery Note Called to attend this delivery d/t rapid progression and Dr. Claiborne Billings being in the OR.  After 1 push, at 8:09 AM a viable female was delivered via  (Presentation:  LOA  ).  APGAR: 9, 9; weight pending.  After 1 minute, the cord was clamped and cut. 40 units of pitocin diluted in 1000cc LR was infused rapidly IV.  The placenta separated spontaneously and delivered via CCT and maternal pushing effort.  It was inspected and appears to be intact with a 3 VC. Bleeding was heavy despite pitocin, of cytotec PR, massage. Jada inserted per manufacture's instructions, balloon inflated w/120cc H20.  Dr. Adrian Blackwater in room at this point.  Bleeding immediately resolved.  Dr. Adrian Blackwater updated Dr. Claiborne Billings.    Anesthesia: Epidural Episiotomy:   Lacerations:  none Suture Repair:  Est. Blood Loss (mL):  719  Mom to postpartum.  Baby to Couplet care / Skin to Skin.  Jacklyn Shell 03/26/2021, 8:33 AM

## 2020-04-17 ENCOUNTER — Other Ambulatory Visit: Payer: Self-pay

## 2020-04-17 ENCOUNTER — Emergency Department (HOSPITAL_COMMUNITY)
Admission: EM | Admit: 2020-04-17 | Discharge: 2020-04-17 | Disposition: A | Discharge status: Home / Self Care | Payer: Medicaid Other | Attending: Emergency Medicine | Admitting: Emergency Medicine

## 2020-04-17 ENCOUNTER — Encounter (HOSPITAL_COMMUNITY): Payer: Self-pay | Admitting: Emergency Medicine

## 2020-04-17 DIAGNOSIS — U071 COVID-19: Secondary | ICD-10-CM | POA: Diagnosis not present

## 2020-04-17 DIAGNOSIS — M791 Myalgia, unspecified site: Secondary | ICD-10-CM | POA: Diagnosis present

## 2020-04-17 DIAGNOSIS — F17210 Nicotine dependence, cigarettes, uncomplicated: Secondary | ICD-10-CM | POA: Insufficient documentation

## 2020-04-17 LAB — RESP PANEL BY RT-PCR (RSV, FLU A&B, COVID)  RVPGX2
Influenza A by PCR: NEGATIVE
Influenza B by PCR: NEGATIVE
Resp Syncytial Virus by PCR: NEGATIVE
SARS Coronavirus 2 by RT PCR: POSITIVE — AB

## 2020-04-17 MED ORDER — ONDANSETRON HCL 4 MG PO TABS: 4.0000 mg | ORAL_TABLET | Freq: Four times a day (QID) | ORAL | 0 refills | Status: AC

## 2020-04-17 MED ORDER — ACETAMINOPHEN 325 MG PO TABS
650.0000 mg | ORAL_TABLET | Freq: Once | ORAL | Status: AC
  Administered 2020-04-17: 650 mg via ORAL
  Filled 2020-04-17: qty 2

## 2020-04-17 NOTE — Discharge Instructions (Signed)
You tested positive for COVID-19 on today's visit. I have also provided medication to help with nausea, please take this as needed.   I have provided a work note, you should quarantine for the next 7 days.  Please continue to take Tylenol to help with your fever, continue to hydrate plenty of fluids.

## 2020-04-17 NOTE — ED Provider Notes (Signed)
Stacey Woods   CSN: [id number] Arrival date & time: 04/17/20  1024     History Chief Complaint  Patient presents with  . covid symptoms    Stacey Woods is a 20 y.o. female.  20 y.o female with no PMH presents to the ED with a chief complaint of body aches, sore throat, URI symptoms times yesterday.  Patient was recently tested for COVID-19 exactly 4 days ago after an exposure and tested negative.  On today's visit, Stacey Woods returns with a fever with a T-max of 101.2, has taken Tylenol to break her symptoms without any improvement.  Stacey Woods was retested on today's visit, tested positive. Patient also endorses body aches, rhinorrhea which Stacey Woods has taken tylenol for. No chest pain, no shortness of breath, no cough. Not on any supplemental estrogen.   The history is provided by the patient.       History reviewed. No pertinent past medical history.  There are no problems to display for this patient.   Past Surgical History:  Procedure Laterality Date  . NO PAST SURGERIES    . SESAMOIDECTOMY Right 11/10/2019   Procedure: Right plantar plate repair;  Surgeon: Toni Arthurs, MD;  Location: [facility];  Service: Orthopedics;  Laterality: Right;     OB History   No obstetric history on file.     No family history on file.  Social History   Tobacco Use  . Smoking status: Current Some Day Smoker    Types: Cigarettes  . Smokeless tobacco: Never Used  . Tobacco comment: stopped smoking a week ago   Substance Use Topics  . Alcohol use: Never  . Drug use: Never    Home Medications Prior to Admission medications   Medication Sig Start Date End Date Taking? Authorizing Provider  aspirin 81 MG chewable tablet Chew by mouth daily.    [provider]  cephALEXin (KEFLEX) 500 MG capsule Take 1 capsule (500 mg total) by mouth 2 (two) times daily. 12/07/19   Cathie Hoops, Amy V, PA-C  fluconazole (DIFLUCAN) 150 MG tablet Take  1 tablet (150 mg total) by mouth daily. 12/10/19   Hall-Potvin, [place], PA-C  HYDROcodone-acetaminophen (NORCO/VICODIN) 5-325 MG tablet hydrocodone 5 mg-acetaminophen 325 mg tablet  TAKE 1 TABLET BY MOUTH EVERY 4 HOURS AS NEEDED FOR SEVERE PAIN    [provider]  metroNIDAZOLE (FLAGYL) 500 MG tablet Take 1 tablet (500 mg total) by mouth 2 (two) times daily. 12/10/19   Hall-Potvin, [place], PA-C    Allergies    Patient has no known allergies.  Review of Systems   Review of Systems  Constitutional: Positive for fever.  HENT: Positive for rhinorrhea.   Musculoskeletal: Positive for myalgias.    Physical Exam Updated Vital Signs BP 108/81   Pulse (!) 105   Temp 99.3 F (37.4 C) (Oral)   Resp 18   Ht 5\' 10"  (1.778 m)   SpO2 100%   BMI 20.81 kg/m   Physical Exam Vitals and nursing Woods reviewed.  Constitutional:      Appearance: Stacey Woods is ill-appearing.  HENT:     Head: Normocephalic and atraumatic.     Mouth/Throat:     Mouth: Mucous membranes are dry.  Cardiovascular:     Rate and Rhythm: Normal rate.  Pulmonary:     Effort: Pulmonary effort is normal.     Breath sounds: No wheezing or rales.     Comments: No wheezing, rhonchi  Or rales.  Abdominal:     General: Abdomen is flat.  Musculoskeletal:     Cervical back: Normal range of motion and neck supple.  Skin:    General: Skin is warm and dry.  Neurological:     Mental Status: Stacey Woods is alert and oriented to person, place, and time.     ED Results / Procedures / Treatments   Labs (all labs ordered are listed, but only abnormal results are displayed) Labs Reviewed  RESP PANEL BY RT-PCR (RSV, FLU A&B, COVID)  RVPGX2 - Abnormal; Notable for the following components:      Result Value   SARS Coronavirus 2 by RT PCR POSITIVE (*)    All other components within normal limits    EKG None  Radiology No results found.  Procedures Procedures (including critical care time)  Medications Ordered in  ED Medications  acetaminophen (TYLENOL) tablet 650 mg (650 mg Oral Given 04/17/20 1122)    ED Course  I have reviewed the triage vital signs and the nursing notes.  Pertinent labs & imaging results that were available during my care of the patient were reviewed by me and considered in my medical decision making (see chart for details).    MDM Rules/Calculators/A&P    Patient here with URI symptoms consistent with fever, cough, rhinorrhea.  Tested negative for COVID-19 5 days ago.  Stacey Woods returns today as symptoms have worsened, including body aches, fever with a T-max of 101.5, has been taking Tylenol without much improvement in her symptoms.  Stacey Woods was positive for COVID-19 infection on today's visit.  Stacey Woods was ambulated personally by me, no hypoxia, no tachycardia, remained with oxygen saturations above 97%.  Temperature was rechecked and 99.3, Stacey Woods was asked to to continue care at home with symptomatic treatment.  Patient understands and agrees with management, return precautions discussed at length.   TERAH ROBEY was evaluated in Emergency Department on 04/17/2020 for the symptoms described in the history of present illness. Stacey Woods was evaluated in the context of the global COVID-19 pandemic, which necessitated consideration that the patient might be at risk for infection with the SARS-CoV-2 virus that causes COVID-19. Institutional protocols and algorithms that pertain to the evaluation of patients at risk for COVID-19 are in a state of rapid change based on information released by regulatory bodies including the CDC and federal and state organizations. These policies and algorithms were followed during the patient's care in the ED.   Portions of this Woods were generated with Scientist, clinical (histocompatibility and immunogenetics). Dictation errors may occur despite best attempts at proofreading.  Final Clinical Impression(s) / ED Diagnoses Final diagnoses:  COVID-19 virus infection    Rx / DC Orders ED Discharge Orders     None       Claude Manges, PA-C 04/17/20 1407    Mancel Bale, MD 04/18/20 [phone]

## 2020-04-17 NOTE — ED Triage Notes (Signed)
Here on 30th after exposure, neg covid test-- now is sick with fever, congestion, sneezing, coughing, body aches.Marland Kitchen

## 2020-07-16 ENCOUNTER — Ambulatory Visit (INDEPENDENT_AMBULATORY_CARE_PROVIDER_SITE_OTHER): Payer: Medicaid Other | Admitting: Primary Care

## 2020-08-19 ENCOUNTER — Inpatient Hospital Stay (HOSPITAL_COMMUNITY)
Admission: AD | Admit: 2020-08-19 | Discharge: 2020-08-19 | Disposition: A | Discharge status: Home / Self Care | Payer: BC Managed Care – PPO | Attending: Obstetrics & Gynecology | Admitting: Obstetrics & Gynecology

## 2020-08-19 ENCOUNTER — Encounter (HOSPITAL_COMMUNITY): Payer: Self-pay | Admitting: Obstetrics & Gynecology

## 2020-08-19 ENCOUNTER — Other Ambulatory Visit: Payer: Self-pay

## 2020-08-19 DIAGNOSIS — O219 Vomiting of pregnancy, unspecified: Secondary | ICD-10-CM | POA: Diagnosis not present

## 2020-08-19 DIAGNOSIS — R110 Nausea: Secondary | ICD-10-CM | POA: Insufficient documentation

## 2020-08-19 DIAGNOSIS — O26899 Other specified pregnancy related conditions, unspecified trimester: Secondary | ICD-10-CM | POA: Diagnosis not present

## 2020-08-19 DIAGNOSIS — Z3A00 Weeks of gestation of pregnancy not specified: Secondary | ICD-10-CM

## 2020-08-19 DIAGNOSIS — Z3201 Encounter for pregnancy test, result positive: Secondary | ICD-10-CM | POA: Insufficient documentation

## 2020-08-19 LAB — POCT PREGNANCY, URINE: Preg Test, Ur: POSITIVE — AB

## 2020-08-19 MED ORDER — FAMOTIDINE 20 MG PO TABS
20.0000 mg | ORAL_TABLET | Freq: Two times a day (BID) | ORAL | 0 refills | Status: DC
Start: 2020-08-19 — End: 2021-03-11

## 2020-08-19 MED ORDER — PROMETHAZINE HCL 25 MG PO TABS
25.0000 mg | ORAL_TABLET | Freq: Four times a day (QID) | ORAL | 1 refills | Status: DC | PRN
Start: 2020-08-19 — End: 2021-03-11

## 2020-08-19 MED ORDER — ONDANSETRON 4 MG PO TBDP
4.0000 mg | ORAL_TABLET | Freq: Three times a day (TID) | ORAL | 1 refills | Status: DC | PRN
Start: 2020-08-19 — End: 2021-03-11

## 2020-08-19 NOTE — MAU Provider Note (Signed)
Event Date/Time   First Provider Initiated Contact with Patient 08/19/20 1149      S Ms. Stacey Woods is a 20 y.o. G1P0 patient who presents to MAU today with complaint of nausea. She reports since she found out she is pregnant, she has been very nauseous. She does not vomit. She denies any abdominal pain, vaginal bleeding or discharge.    O BP 121/74 (BP Location: Right Arm)   Pulse 63   Temp 98.2 F (36.8 C) (Oral)   Resp 15   Ht 5\' 4"  (1.626 m)   Wt 64.3 kg   LMP 07/05/2020   SpO2 100%   BMI 24.32 kg/m  Physical Exam Vitals and nursing note reviewed.  Constitutional:      General: She is not in acute distress.    Appearance: She is well-developed.  HENT:     Head: Normocephalic.  Eyes:     Pupils: Pupils are equal, round, and reactive to light.  Cardiovascular:     Rate and Rhythm: Normal rate.  Pulmonary:     Effort: Pulmonary effort is normal. No respiratory distress.  Skin:    General: Skin is warm and dry.  Neurological:     Mental Status: She is alert and oriented to person, place, and time.  Psychiatric:        Behavior: Behavior normal.        Thought Content: Thought content normal.        Judgment: Judgment normal.     A Medical screening exam complete 1. Positive pregnancy test   2. Pregnancy related nausea, antepartum    Patient requesting RX for nausea medication and does not want to wait for UA or medication while in MAU.  P -Discharge home in stable condition -Rx for zofran, phenergan and pepcid sent to patient's pharmacy -First trimester precautions discussed -Patient advised to follow-up with OB to establish prenatal care, list given -Patient may return to MAU as needed or if her condition were to change or worsen   07/07/2020, Rolm Bookbinder 08/19/2020 11:56 AM

## 2020-08-19 NOTE — MAU Note (Signed)
Pt reports to mau with c/o nausea and 1 episode of vomiting. Pt denies abd pain or bleeding

## 2020-08-19 NOTE — Discharge Instructions (Signed)
Prenatal Care Providers           [facility] for [facility] @ [facility]  [address] [phone]  [facility] for [facility] @ [facility]   [address]  [phone]  [facility] @ [facility]       [address] [phone]            [facility] for [facility] @ [facility]     [phone]          [facility] for [facility] @ [facility]   [address] [phone]  [facility] for [facility] @ [facility]  [address] [phone]     Center for [facility] @ Family [address] [place])  [address]   [phone]     [facility]  Phone: [phone]  [facility]  Phone: [phone]  [facility] Phone: [phone]  [facility] Phone: [phone]  [facility] Phone: [phone]  [facility] Phone: [phone]  [facility]  Phone: [phone]  Safe Medications in Pregnancy   Acne: Benzoyl Peroxide Salicylic Acid  Backache/Headache: Tylenol: 2 regular strength every 4 hours OR              2 Extra strength every 6 hours  Colds/Coughs/Allergies: Benadryl (alcohol free) 25 mg every 6 hours as needed Breath right strips Claritin Cepacol throat lozenges Chloraseptic throat spray Cold-Eeze- up to three times per day Cough drops, alcohol free Flonase (by prescription only) Guaifenesin Mucinex Robitussin DM (plain only, alcohol free) Saline nasal spray/drops Sudafed (pseudoephedrine) & Actifed ** use only after [redacted] weeks gestation and if you do not have high blood pressure Tylenol Vicks Vaporub Zinc lozenges Zyrtec   Constipation: Colace Ducolax suppositories Fleet enema Glycerin suppositories Metamucil Milk of magnesia Miralax Senokot Smooth move tea  Diarrhea: Kaopectate Imodium A-D  *NO pepto  Bismol  Hemorrhoids: Anusol Anusol HC Preparation H Tucks  Indigestion: Tums Maalox Mylanta Zantac  Pepcid  Insomnia: Benadryl (alcohol free) 25mg  every 6 hours as needed Tylenol PM Unisom, no Gelcaps  Leg Cramps: Tums MagGel  Nausea/Vomiting:  Bonine Dramamine Emetrol Ginger extract Sea bands Meclizine  Nausea medication to take during pregnancy:  Unisom (doxylamine succinate 25 mg tablets) Take one tablet daily at bedtime. If symptoms are not adequately controlled, the dose can be increased to a maximum recommended dose of two tablets daily (1/2 tablet in the morning, 1/2 tablet mid-afternoon and one at bedtime). Vitamin B6 100mg  tablets. Take one tablet twice a day (up to 200 mg per day).  Skin Rashes: Aveeno products Benadryl cream or 25mg  every 6 hours as needed Calamine Lotion 1% cortisone cream  Yeast infection: Gyne-lotrimin 7 Monistat 7   **If taking multiple medications, please check labels to avoid duplicating the same active ingredients **take medication as directed on the label ** Do not exceed 4000 mg of tylenol in 24 hours **Do not take medications that contain aspirin or ibuprofen     Obstetrics: Normal and Problem Pregnancies (7th ed., pp. 102-121). [place], [place]: Elsevier."> Textbook of Family Medicine (9th ed., pp. [phone]). [place], [place]: Elsevier [employer].">  First Trimester of Pregnancy  The first trimester of pregnancy starts on the first day of your last menstrual period until the end of week 12. This is months 1 through 3 of pregnancy. A week after a sperm fertilizes an egg, the egg will  implant into the wall of the uterus and begin to develop into a baby. By the end of 12 weeks, all the baby's organs will be formed and the baby will be 2-3 inches in size. Body changes during your first trimester Your body goes through many changes during pregnancy. The changes vary and generally return to normal after your baby is  born. Physical changes  You may gain or lose weight.  Your breasts may begin to grow larger and become tender. The tissue that surrounds your nipples (areola) may become darker.  Dark spots or blotches (chloasma or mask of pregnancy) may develop on your face.  You may have changes in your hair. These can include thickening or thinning of your hair or changes in texture. Health changes  You may feel nauseous, and you may vomit.  You may have heartburn.  You may develop headaches.  You may develop constipation.  Your gums may bleed and may be sensitive to brushing and flossing. Other changes  You may tire easily.  You may urinate more often.  Your menstrual periods will stop.  You may have a loss of appetite.  You may develop cravings for certain kinds of food.  You may have changes in your emotions from day to day.  You may have more vivid and strange dreams. Follow these instructions at home: Medicines  Follow your health care provider's instructions regarding medicine use. Specific medicines may be either safe or unsafe to take during pregnancy. Do not take any medicines unless told to by your health care provider.  Take a prenatal vitamin that contains at least 600 micrograms (mcg) of folic acid. Eating and drinking  Eat a healthy diet that includes fresh fruits and vegetables, whole grains, good sources of protein such as meat, eggs, or tofu, and low-fat dairy products.  Avoid raw meat and unpasteurized juice, milk, and cheese. These carry germs that can harm you and your baby.  If you feel nauseous or you vomit: ? Eat 4 or 5 small meals a day instead of 3 large meals. ? Try eating a few soda crackers. ? Drink liquids between meals instead of during meals.  You may need to take these actions to prevent or treat constipation: ? Drink enough fluid to keep your urine pale yellow. ? Eat foods that are high in fiber, such as beans, whole grains, and fresh fruits  and vegetables. ? Limit foods that are high in fat and processed sugars, such as fried or sweet foods. Activity  Exercise only as directed by your health care provider. Most people can continue their usual exercise routine during pregnancy. Try to exercise for 30 minutes at least 5 days a week.  Stop exercising if you develop pain or cramping in the lower abdomen or lower back.  Avoid exercising if it is very hot or humid or if you are at high altitude.  Avoid heavy lifting.  If you choose to, you may have sex unless your health care provider tells you not to. Relieving pain and discomfort  Wear a good support bra to relieve breast tenderness.  Rest with your legs elevated if you have leg cramps or low back pain.  If you develop bulging veins (varicose veins) in your legs: ? Wear support hose as told by your health care provider. ? Elevate your feet for 15 minutes, 3-4 times a day. ? Limit salt in your diet. Safety  Wear your seat belt at all times when driving or riding in  a car.  Talk with your health care provider if someone is verbally or physically abusive to you.  Talk with your health care provider if you are feeling sad or have thoughts of hurting yourself. Lifestyle  Do not use hot tubs, steam rooms, or saunas.  Do not douche. Do not use tampons or scented sanitary pads.  Do not use herbal remedies, alcohol, illegal drugs, or medicines that are not approved by your health care provider. Chemicals in these products can harm your baby.  Do not use any products that contain nicotine or tobacco, such as cigarettes, e-cigarettes, and chewing tobacco. If you need help quitting, ask your health care provider.  Avoid cat litter boxes and soil used by cats. These carry germs that can cause birth defects in the baby and possibly loss of the unborn baby (fetus) by miscarriage or stillbirth. General instructions  During routine prenatal visits in the first trimester, your  health care provider will do a physical exam, perform necessary tests, and ask you how things are going. Keep all follow-up visits. This is important.  Ask for help if you have counseling or nutritional needs during pregnancy. Your health care provider can offer advice or refer you to specialists for help with various needs.  Schedule a dentist appointment. At home, brush your teeth with a soft toothbrush. Floss gently.  Write down your questions. Take them to your prenatal visits. Where to find more information  American Pregnancy Association: americanpregnancy.org  [employer] of Obstetricians and Gynecologists: https://www.todd-brady.net/  Office on [employer] Health: [employer].nz Contact a health care provider if you have:  Dizziness.  A fever.  Mild pelvic cramps, pelvic pressure, or nagging pain in the abdominal area.  Nausea, vomiting, or diarrhea that lasts for 24 hours or longer.  A bad-smelling vaginal discharge.  Pain when you urinate.  Known exposure to a contagious illness, such as chickenpox, measles, Zika virus, HIV, or hepatitis. Get help right away if you have:  Spotting or bleeding from your vagina.  Severe abdominal cramping or pain.  Shortness of breath or chest pain.  Any kind of trauma, such as from a fall or a car crash.  New or increased pain, swelling, or redness in an arm or leg. Summary  The first trimester of pregnancy starts on the first day of your last menstrual period until the end of week 12 (months 1 through 3).  Eating 4 or 5 small meals a day rather than 3 large meals may help to relieve nausea and vomiting.  Do not use any products that contain nicotine or tobacco, such as cigarettes, e-cigarettes, and chewing tobacco. If you need help quitting, ask your health care provider.  Keep all follow-up visits. This is important. This information is not intended to replace advice given to you by your health care  provider. Make sure you discuss any questions you have with your health care provider. Document Revised: 09/07/2019 Document Reviewed: 07/14/2019 Elsevier Patient Education  2021 [employer].

## 2020-09-07 DIAGNOSIS — Z3490 Encounter for supervision of normal pregnancy, unspecified, unspecified trimester: Secondary | ICD-10-CM | POA: Insufficient documentation

## 2020-09-11 DIAGNOSIS — A549 Gonococcal infection, unspecified: Secondary | ICD-10-CM

## 2020-09-11 HISTORY — DX: Gonococcal infection, unspecified: A54.9

## 2020-09-11 LAB — OB RESULTS CONSOLE GC/CHLAMYDIA: Gonorrhea: POSITIVE

## 2020-10-01 LAB — OB RESULTS CONSOLE HIV ANTIBODY (ROUTINE TESTING): HIV: NONREACTIVE

## 2020-10-01 LAB — OB RESULTS CONSOLE HEPATITIS B SURFACE ANTIGEN: Hepatitis B Surface Ag: NEGATIVE

## 2020-10-01 LAB — OB RESULTS CONSOLE RUBELLA ANTIBODY, IGM: Rubella: IMMUNE

## 2020-10-03 LAB — OB RESULTS CONSOLE RPR: RPR: NONREACTIVE

## 2020-10-29 LAB — OB RESULTS CONSOLE GC/CHLAMYDIA
Chlamydia: NEGATIVE
Gonorrhea: NEGATIVE

## 2021-02-25 ENCOUNTER — Ambulatory Visit: Payer: Medicaid Other

## 2021-02-26 ENCOUNTER — Other Ambulatory Visit: Payer: Self-pay

## 2021-03-01 ENCOUNTER — Other Ambulatory Visit: Payer: Self-pay | Admitting: Obstetrics and Gynecology

## 2021-03-01 DIAGNOSIS — Z363 Encounter for antenatal screening for malformations: Secondary | ICD-10-CM

## 2021-03-04 ENCOUNTER — Ambulatory Visit: Payer: Medicaid Other | Admitting: *Deleted

## 2021-03-04 ENCOUNTER — Ambulatory Visit: Payer: Medicaid Other | Attending: Obstetrics and Gynecology | Admitting: Obstetrics and Gynecology

## 2021-03-04 ENCOUNTER — Encounter: Payer: Self-pay | Admitting: *Deleted

## 2021-03-04 ENCOUNTER — Ambulatory Visit (HOSPITAL_BASED_OUTPATIENT_CLINIC_OR_DEPARTMENT_OTHER): Payer: Medicaid Other

## 2021-03-04 ENCOUNTER — Other Ambulatory Visit: Payer: Self-pay | Admitting: *Deleted

## 2021-03-04 ENCOUNTER — Other Ambulatory Visit: Payer: Self-pay | Admitting: Obstetrics and Gynecology

## 2021-03-04 ENCOUNTER — Other Ambulatory Visit: Payer: Self-pay

## 2021-03-04 VITALS — BP 117/60 | HR 74

## 2021-03-04 DIAGNOSIS — Z3A34 34 weeks gestation of pregnancy: Secondary | ICD-10-CM | POA: Diagnosis not present

## 2021-03-04 DIAGNOSIS — O365930 Maternal care for other known or suspected poor fetal growth, third trimester, not applicable or unspecified: Secondary | ICD-10-CM | POA: Insufficient documentation

## 2021-03-04 DIAGNOSIS — Z363 Encounter for antenatal screening for malformations: Secondary | ICD-10-CM

## 2021-03-04 DIAGNOSIS — O365990 Maternal care for other known or suspected poor fetal growth, unspecified trimester, not applicable or unspecified: Secondary | ICD-10-CM

## 2021-03-04 DIAGNOSIS — O26849 Uterine size-date discrepancy, unspecified trimester: Secondary | ICD-10-CM

## 2021-03-04 NOTE — Procedures (Signed)
NERIDA BOIVIN Oct 03, 2000 [redacted]w[redacted]d  Fetus A Non-Stress Test Interpretation for 03/04/21  Indication: IUGR and Unsatisfactory BPP  Fetal Heart Rate A Mode: External Baseline Rate (A): 145 bpm Variability: Moderate Accelerations: 15 x 15 Decelerations: None Multiple birth?: No  Uterine Activity Mode: Palpation, Toco Contraction Frequency (min): Occas w/UI Contraction Quality: Mild Resting Tone Palpated: Relaxed Resting Time: Adequate  Interpretation (Fetal Testing) Nonstress Test Interpretation: Reactive Comments: Dr. Judeth Cornfield reviewed tracing.

## 2021-03-04 NOTE — Progress Notes (Signed)
Maternal-Fetal Medicine   Name: Stacey Woods DOB: 11/23/2000 MRN: [id number] Referring Provider: Philip Aspen, DO  I had the pleasure of seeing St[facility] at [facility]. She is G1 P0 at 34w 4d gestation and is here for a second opinion.  She was accompanied by her mother.  At your office ultrasound, fetal growth restriction was suspected.  Her prenatal course, otherwise, has been uneventful.  On cell free fetal DNA screening, the risks of fetal aneuploidies are not increased.  MSAFP screening showed low risk for open neural tube defects.  She does not have gestational diabetes.  Her blood pressures have been normal at prenatal visits.  Past medical history: Patient reports she has anemia and takes iron supplements.  No history of diabetes or hypertension or any chronic medical conditions.  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 Past surgical history: Nil of note. Medications: Prenatal vitamins, iron supplements. Allergies: No known drug allergies. Social history: Denies tobacco or drug or alcohol use.  Her partner is Stacey Woods and he is in good health.  Both patient and her partner do not have sickle cell trait. Family history: Mother has hypertension.  No history of venous thromboembolism in the family. GYN history: No history of abnormal Pap smears or cervical surgeries.  Blood pressure today at her office is 117/60 mmHg.  Ultrasound On today's ultrasound, the estimated fetal weight is at the 7th percentile.  Abdominal circumference measurement is at the 5th percentile.  Amniotic fluid is normal and good fetal activity seen.  Fetal anatomical survey appears normal but limited by advanced gestational age.  Cephalic presentation.  Umbilical artery Doppler showed normal forward diastolic flow.  Fetal breathing movements did not meet the criteria of BPP.  NST reactive.  BPP 8/10.  I counseled  the patient on the following: Fetal growth restriction I explained the finding of fetal growth restriction that is difficult to differentiate from a constitutionally small fetus.  Possible causes of fetal growth restriction include placental insufficiency (most common cause), fetal chromosomal anomalies or genetic conditions, and fetal infections (rare).  Normal fetal anatomical survey and low risk on screening for aneuploidies indicate that chromosomal anomalies or less likely.  However, only amniocentesis will give a definitive result on the fetal karyotype. I discussed ultrasound protocol of monitoring fetal growth restriction by weekly antenatal testing.  I discussed the timing of delivery.  At her next fetal growth assessment in 3 weeks, we will recommend timing of delivery.  If fetal growth restriction persists with normal antenatal testing, we will recommend delivery at 38 to [redacted] weeks gestation.  Recommendations -Continue weekly BPP, NST and UA Doppler studies till delivery. -Fetal growth assessment in 3 weeks.  Thank you for consultation.  If you have any questions or concerns, please contact me the [facility].  Consultation including face-to-face (more than 50%) counseling 30 minutes.

## 2021-03-11 ENCOUNTER — Ambulatory Visit: Payer: Medicaid Other | Attending: Obstetrics and Gynecology

## 2021-03-11 ENCOUNTER — Ambulatory Visit: Payer: Medicaid Other | Admitting: *Deleted

## 2021-03-11 ENCOUNTER — Other Ambulatory Visit: Payer: Self-pay

## 2021-03-11 ENCOUNTER — Encounter: Payer: Self-pay | Admitting: *Deleted

## 2021-03-11 VITALS — BP 110/69 | HR 94

## 2021-03-11 DIAGNOSIS — O365990 Maternal care for other known or suspected poor fetal growth, unspecified trimester, not applicable or unspecified: Secondary | ICD-10-CM | POA: Insufficient documentation

## 2021-03-11 DIAGNOSIS — O365930 Maternal care for other known or suspected poor fetal growth, third trimester, not applicable or unspecified: Secondary | ICD-10-CM

## 2021-03-11 DIAGNOSIS — Z3A35 35 weeks gestation of pregnancy: Secondary | ICD-10-CM | POA: Diagnosis not present

## 2021-03-11 NOTE — Procedures (Signed)
Stacey Woods 2001/03/11 [redacted]w[redacted]d  Fetus A Non-Stress Test Interpretation for 03/11/21  Indication: IUGR  Fetal Heart Rate A Mode: External Baseline Rate (A): 135 bpm Variability: Moderate Accelerations: 15 x 15 Decelerations: None Multiple birth?: No  Uterine Activity Mode: Toco Contraction Frequency (min): rare Contraction Quality: Mild Resting Tone Palpated: Relaxed Resting Time: Adequate  Interpretation (Fetal Testing) Nonstress Test Interpretation: Reactive Overall Impression: Reassuring for gestational age Comments: tracing reviewed by Dr. Judeth Cornfield

## 2021-03-11 NOTE — Procedures (Unsigned)
No note

## 2021-03-18 ENCOUNTER — Ambulatory Visit: Payer: Medicaid Other

## 2021-03-19 ENCOUNTER — Ambulatory Visit: Payer: Medicaid Other

## 2021-03-19 ENCOUNTER — Ambulatory Visit: Payer: Medicaid Other | Attending: Obstetrics and Gynecology

## 2021-03-20 LAB — OB RESULTS CONSOLE GBS: GBS: NEGATIVE

## 2021-03-24 ENCOUNTER — Other Ambulatory Visit: Payer: Self-pay

## 2021-03-24 ENCOUNTER — Encounter (HOSPITAL_COMMUNITY): Payer: Self-pay | Admitting: Obstetrics and Gynecology

## 2021-03-24 ENCOUNTER — Inpatient Hospital Stay (HOSPITAL_COMMUNITY)
Admission: AD | Admit: 2021-03-24 | Discharge: 2021-03-24 | Disposition: A | Discharge status: Home / Self Care | Payer: Medicaid Other | Attending: Obstetrics and Gynecology | Admitting: Obstetrics and Gynecology

## 2021-03-24 DIAGNOSIS — O26893 Other specified pregnancy related conditions, third trimester: Secondary | ICD-10-CM | POA: Insufficient documentation

## 2021-03-24 DIAGNOSIS — Z3A37 37 weeks gestation of pregnancy: Secondary | ICD-10-CM | POA: Diagnosis not present

## 2021-03-24 DIAGNOSIS — Z3689 Encounter for other specified antenatal screening: Secondary | ICD-10-CM | POA: Insufficient documentation

## 2021-03-24 DIAGNOSIS — R0989 Other specified symptoms and signs involving the circulatory and respiratory systems: Secondary | ICD-10-CM | POA: Insufficient documentation

## 2021-03-24 DIAGNOSIS — Z20822 Contact with and (suspected) exposure to covid-19: Secondary | ICD-10-CM | POA: Diagnosis not present

## 2021-03-24 DIAGNOSIS — J029 Acute pharyngitis, unspecified: Secondary | ICD-10-CM | POA: Insufficient documentation

## 2021-03-24 DIAGNOSIS — O99513 Diseases of the respiratory system complicating pregnancy, third trimester: Secondary | ICD-10-CM | POA: Diagnosis not present

## 2021-03-24 DIAGNOSIS — Z87891 Personal history of nicotine dependence: Secondary | ICD-10-CM | POA: Insufficient documentation

## 2021-03-24 DIAGNOSIS — O99891 Other specified diseases and conditions complicating pregnancy: Secondary | ICD-10-CM | POA: Diagnosis not present

## 2021-03-24 LAB — URINALYSIS, ROUTINE W REFLEX MICROSCOPIC
Bilirubin Urine: NEGATIVE
Glucose, UA: NEGATIVE mg/dL
Hgb urine dipstick: NEGATIVE
Ketones, ur: NEGATIVE mg/dL
Leukocytes,Ua: NEGATIVE
Nitrite: NEGATIVE
Protein, ur: NEGATIVE mg/dL
Specific Gravity, Urine: 1.008 (ref 1.005–1.030)
pH: 6 (ref 5.0–8.0)

## 2021-03-24 LAB — RESP PANEL BY RT-PCR (FLU A&B, COVID) ARPGX2
Influenza A by PCR: NEGATIVE
Influenza B by PCR: NEGATIVE
SARS Coronavirus 2 by RT PCR: NEGATIVE

## 2021-03-24 NOTE — MAU Provider Note (Signed)
History     CSN: [id number]  Arrival date and time: 03/24/21 1350   Event Date/Time   First Provider Initiated Contact with Patient 03/24/21 1432      Chief Complaint  Patient presents with   Flu Symptoms   20 y.o. G1 @37 .3 wks presenting with cold sx. Reports onset 1 week ago. Reports non-productive cough, sore throat, and nasal congestion. Reports exposure to nephew who tested pos for flu. Denies fever or body aches. Reports good FM. Reports using Mucinex but states not helping. Has not taken Flu shot.   OB History     Gravida  1   Para      Term      Preterm      AB      Living         SAB      IAB      Ectopic      Multiple      Live Births              Past Medical History:  Diagnosis Date   Anemia    GC (gonococcus)     Past Surgical History:  Procedure Laterality Date   SESAMOIDECTOMY Right 11/10/2019   Procedure: Right plantar plate repair;  Surgeon: 11/12/2019, MD;  Location: [facility];  Service: Orthopedics;  Laterality: Right;    Family History  Problem Relation Age of Onset   Hypertension Mother    Cancer Paternal Grandmother     Social History   Tobacco Use   Smoking status: Former    Types: Cigarettes    Quit date: 08/28/2020    Years since quitting: 0.5   Smokeless tobacco: Never   Tobacco comments:    stopped smoking a week ago   Vaping Use   Vaping Use: Never used  Substance Use Topics   Alcohol use: Never   Drug use: Never    Allergies: No Known Allergies  No medications prior to admission.    Review of Systems  Constitutional:  Negative for chills and fever.  HENT:  Positive for congestion and sore throat. Negative for ear pain.   Respiratory:  Positive for cough. Negative for shortness of breath.   Cardiovascular:  Negative for chest pain.  Gastrointestinal:  Negative for abdominal pain.  Genitourinary:  Negative for vaginal bleeding.  Physical Exam   Blood pressure 118/62, pulse  74, temperature 98.8 F (37.1 C), temperature source Oral, resp. rate 16, height 5\' 4"  (1.626 m), weight 79.3 kg, last menstrual period 07/05/2020, SpO2 100 %.  Physical Exam Vitals and nursing note reviewed.  Constitutional:      General: She is not in acute distress.    Appearance: Normal appearance.  HENT:     Head: Normocephalic and atraumatic.     Mouth/Throat:     Lips: Pink.     Mouth: Mucous membranes are moist.     Palate: No lesions.     Pharynx: Oropharynx is clear. Uvula midline. No oropharyngeal exudate or posterior oropharyngeal erythema.     Tonsils: No tonsillar exudate or tonsillar abscesses.  Cardiovascular:     Rate and Rhythm: Normal rate and regular rhythm.     Heart sounds: Normal heart sounds.  Pulmonary:     Effort: Pulmonary effort is normal. No respiratory distress.     Breath sounds: Normal breath sounds. No stridor. No wheezing, rhonchi or rales.  Musculoskeletal:        General: Normal range of  motion.     Cervical back: Normal range of motion.  Skin:    General: Skin is warm and dry.  Neurological:     General: No focal deficit present.     Mental Status: She is alert and oriented to person, place, and time.  Psychiatric:        Mood and Affect: Mood normal.        Behavior: Behavior normal.  EFM: 130 bpm, mod variability, + accels, no decels Toco: none  Results for orders placed or performed during the hospital encounter of 03/24/21 (from the past 24 hour(s))  Urinalysis, Routine w reflex microscopic Urine, Clean Catch     Status: Abnormal   Collection Time: 03/24/21  2:14 PM  Result Value Ref Range   Color, Urine YELLOW YELLOW   APPearance HAZY (A) CLEAR   Specific Gravity, Urine 1.008 1.005 - 1.030   pH 6.0 5.0 - 8.0   Glucose, UA NEGATIVE NEGATIVE mg/dL   Hgb urine dipstick NEGATIVE NEGATIVE   Bilirubin Urine NEGATIVE NEGATIVE   Ketones, ur NEGATIVE NEGATIVE mg/dL   Protein, ur NEGATIVE NEGATIVE mg/dL   Nitrite NEGATIVE NEGATIVE    Leukocytes,Ua NEGATIVE NEGATIVE  Resp Panel by RT-PCR (Flu A&B, Covid) Nasopharyngeal Swab     Status: None   Collection Time: 03/24/21  2:25 PM   Specimen: Nasopharyngeal Swab; Nasopharyngeal(NP) swabs in vial transport medium  Result Value Ref Range   SARS Coronavirus 2 by RT PCR NEGATIVE NEGATIVE   Influenza A by PCR NEGATIVE NEGATIVE   Influenza B by PCR NEGATIVE NEGATIVE   MAU Course  Procedures  MDM Labs ordered. Sx consistent with common cold. 1654: Pt notified via phone of negative resp panel. All questions answered.   Assessment and Plan   1. [redacted] weeks gestation of pregnancy   2. NST (non-stress test) reactive   3. Respiratory symptoms    Discharge home Follow up at [facility] as scheduled Return precautions OTC medication list provided  Allergies as of 03/24/2021   No Known Allergies      Medication List     TAKE these medications    aspirin 81 MG chewable tablet Chew by mouth daily.   ferrous sulfate 325 (65 FE) MG tablet Take 325 mg by mouth daily with breakfast.   prenatal multivitamin Tabs tablet Take 1 tablet by mouth daily at 12 noon.         Donette Larry, CNM 03/24/2021, 4:53 PM

## 2021-03-24 NOTE — Discharge Instructions (Signed)

## 2021-03-24 NOTE — MAU Note (Signed)
Presents stating she has flu symptoms.  Reports coughing, runny nose, and sore throat.  States been having symptoms x1 week and has been taking Mucinex for a week.  States she's recently  been in contact with someone diagnosed with the flu. Denies LOF or VB.  Endorses +FM.

## 2021-03-25 ENCOUNTER — Other Ambulatory Visit: Payer: Self-pay | Admitting: Obstetrics and Gynecology

## 2021-03-25 ENCOUNTER — Encounter: Payer: Self-pay | Admitting: *Deleted

## 2021-03-25 ENCOUNTER — Ambulatory Visit: Payer: Medicaid Other | Admitting: *Deleted

## 2021-03-25 ENCOUNTER — Other Ambulatory Visit: Payer: Self-pay

## 2021-03-25 ENCOUNTER — Ambulatory Visit (HOSPITAL_BASED_OUTPATIENT_CLINIC_OR_DEPARTMENT_OTHER): Payer: Medicaid Other

## 2021-03-25 VITALS — BP 114/63 | HR 82

## 2021-03-25 DIAGNOSIS — O365931 Maternal care for other known or suspected poor fetal growth, third trimester, fetus 1: Secondary | ICD-10-CM

## 2021-03-25 DIAGNOSIS — O365930 Maternal care for other known or suspected poor fetal growth, third trimester, not applicable or unspecified: Secondary | ICD-10-CM

## 2021-03-25 DIAGNOSIS — O365990 Maternal care for other known or suspected poor fetal growth, unspecified trimester, not applicable or unspecified: Secondary | ICD-10-CM

## 2021-03-25 DIAGNOSIS — Z3A37 37 weeks gestation of pregnancy: Secondary | ICD-10-CM

## 2021-03-25 NOTE — Procedures (Signed)
Stacey Woods February 24, 2001 [redacted]w[redacted]d  Fetus A Non-Stress Test Interpretation for 03/25/21  Indication: IUGR  Fetal Heart Rate A Mode: External Baseline Rate (A): 140 bpm Variability: Moderate Accelerations: 15 x 15 Decelerations: None Multiple birth?: No  Uterine Activity Mode: Palpation, Toco Contraction Frequency (min): 1 uc Contraction Duration (sec): 70 Contraction Quality: Mild Resting Tone Palpated: Relaxed Resting Time: Adequate  Interpretation (Fetal Testing) Nonstress Test Interpretation: Reactive Overall Impression: Reassuring for gestational age Comments: Dr. Parke Poisson reviewed tracing

## 2021-03-26 ENCOUNTER — Other Ambulatory Visit: Payer: Self-pay

## 2021-03-26 ENCOUNTER — Inpatient Hospital Stay (HOSPITAL_COMMUNITY): Payer: Medicaid Other | Admitting: Anesthesiology

## 2021-03-26 ENCOUNTER — Inpatient Hospital Stay (HOSPITAL_COMMUNITY)
Admission: AD | Admit: 2021-03-26 | Discharge: 2021-03-28 | DRG: 768 | Disposition: A | Discharge status: Home / Self Care | Payer: Medicaid Other | Attending: Obstetrics and Gynecology | Admitting: Obstetrics and Gynecology

## 2021-03-26 ENCOUNTER — Encounter (HOSPITAL_COMMUNITY): Payer: Self-pay | Admitting: Obstetrics and Gynecology

## 2021-03-26 ENCOUNTER — Inpatient Hospital Stay (HOSPITAL_COMMUNITY): Payer: Medicaid Other

## 2021-03-26 DIAGNOSIS — Z3A37 37 weeks gestation of pregnancy: Secondary | ICD-10-CM

## 2021-03-26 DIAGNOSIS — O09299 Supervision of pregnancy with other poor reproductive or obstetric history, unspecified trimester: Secondary | ICD-10-CM | POA: Diagnosis present

## 2021-03-26 DIAGNOSIS — D509 Iron deficiency anemia, unspecified: Secondary | ICD-10-CM | POA: Diagnosis present

## 2021-03-26 DIAGNOSIS — O365931 Maternal care for other known or suspected poor fetal growth, third trimester, fetus 1: Secondary | ICD-10-CM | POA: Diagnosis present

## 2021-03-26 DIAGNOSIS — O365990 Maternal care for other known or suspected poor fetal growth, unspecified trimester, not applicable or unspecified: Secondary | ICD-10-CM

## 2021-03-26 DIAGNOSIS — O9902 Anemia complicating childbirth: Secondary | ICD-10-CM | POA: Diagnosis present

## 2021-03-26 DIAGNOSIS — Z87891 Personal history of nicotine dependence: Secondary | ICD-10-CM

## 2021-03-26 DIAGNOSIS — O365930 Maternal care for other known or suspected poor fetal growth, third trimester, not applicable or unspecified: Secondary | ICD-10-CM | POA: Diagnosis present

## 2021-03-26 HISTORY — DX: Maternal care for other known or suspected poor fetal growth, unspecified trimester, not applicable or unspecified: O36.5990

## 2021-03-26 LAB — CBC
HCT: 32.2 % — ABNORMAL LOW (ref 36.0–46.0)
Hemoglobin: 9.9 g/dL — ABNORMAL LOW (ref 12.0–15.0)
MCH: 23.9 pg — ABNORMAL LOW (ref 26.0–34.0)
MCHC: 30.7 g/dL (ref 30.0–36.0)
MCV: 77.6 fL — ABNORMAL LOW (ref 80.0–100.0)
Platelets: 224 10*3/uL (ref 150–400)
RBC: 4.15 MIL/uL (ref 3.87–5.11)
RDW: 16.8 % — ABNORMAL HIGH (ref 11.5–15.5)
WBC: 8.1 10*3/uL (ref 4.0–10.5)
nRBC: 0.2 % (ref 0.0–0.2)

## 2021-03-26 LAB — TYPE AND SCREEN
ABO/RH(D): B POS
Antibody Screen: NEGATIVE

## 2021-03-26 LAB — RPR: RPR Ser Ql: NONREACTIVE

## 2021-03-26 MED ORDER — LIDOCAINE HCL (PF) 1 % IJ SOLN
INTRAMUSCULAR | Status: DC | PRN
  Administered 2021-03-26: 8 mL via EPIDURAL

## 2021-03-26 MED ORDER — TRANEXAMIC ACID-NACL 1000-0.7 MG/100ML-% IV SOLN
1000.0000 mg | INTRAVENOUS | Status: AC
  Administered 2021-03-26: 1000 mg via INTRAVENOUS
  Filled 2021-03-26: qty 100

## 2021-03-26 MED ORDER — FENTANYL-BUPIVACAINE-NACL 0.5-0.125-0.9 MG/250ML-% EP SOLN
EPIDURAL | Status: AC
  Filled 2021-03-26: qty 250

## 2021-03-26 MED ORDER — PRENATAL MULTIVITAMIN CH
1.0000 | ORAL_TABLET | Freq: Every day | ORAL | Status: DC
  Administered 2021-03-26 – 2021-03-27 (×2): 1 via ORAL
  Filled 2021-03-26 (×2): qty 1

## 2021-03-26 MED ORDER — DIPHENHYDRAMINE HCL 25 MG PO CAPS: 25.0000 mg | ORAL_CAPSULE | Freq: Four times a day (QID) | ORAL | Status: DC | PRN

## 2021-03-26 MED ORDER — BUTORPHANOL TARTRATE 1 MG/ML IJ SOLN
1.0000 mg | INTRAMUSCULAR | Status: DC | PRN
  Administered 2021-03-26: 1 mg via INTRAVENOUS
  Filled 2021-03-26: qty 1

## 2021-03-26 MED ORDER — OXYCODONE HCL 5 MG PO TABS: 5.0000 mg | ORAL_TABLET | ORAL | Status: DC | PRN

## 2021-03-26 MED ORDER — MISOPROSTOL 200 MCG PO TABS: 800.0000 ug | ORAL_TABLET | Freq: Once | ORAL | Status: AC

## 2021-03-26 MED ORDER — LIDOCAINE HCL (PF) 1 % IJ SOLN: 30.0000 mL | INTRAMUSCULAR | Status: DC | PRN

## 2021-03-26 MED ORDER — SENNOSIDES-DOCUSATE SODIUM 8.6-50 MG PO TABS
2.0000 | ORAL_TABLET | Freq: Every day | ORAL | Status: DC
  Administered 2021-03-27: 13:00:00 2 via ORAL
  Filled 2021-03-26: qty 2

## 2021-03-26 MED ORDER — LACTATED RINGERS IV SOLN
500.0000 mL | Freq: Once | INTRAVENOUS | Status: AC
  Administered 2021-03-26: 500 mL via INTRAVENOUS

## 2021-03-26 MED ORDER — ZOLPIDEM TARTRATE 5 MG PO TABS: 5.0000 mg | ORAL_TABLET | Freq: Every evening | ORAL | Status: DC | PRN

## 2021-03-26 MED ORDER — WITCH HAZEL-GLYCERIN EX PADS: 1.0000 "application " | MEDICATED_PAD | CUTANEOUS | Status: DC | PRN

## 2021-03-26 MED ORDER — ONDANSETRON HCL 4 MG PO TABS: 4.0000 mg | ORAL_TABLET | ORAL | Status: DC | PRN

## 2021-03-26 MED ORDER — SOD CITRATE-CITRIC ACID 500-334 MG/5ML PO SOLN: 30.0000 mL | ORAL | Status: DC | PRN

## 2021-03-26 MED ORDER — COCONUT OIL OIL: 1.0000 "application " | TOPICAL_OIL | Status: DC | PRN

## 2021-03-26 MED ORDER — DIPHENHYDRAMINE HCL 50 MG/ML IJ SOLN: 12.5000 mg | INTRAMUSCULAR | Status: DC | PRN

## 2021-03-26 MED ORDER — MISOPROSTOL 25 MCG QUARTER TABLET
25.0000 ug | ORAL_TABLET | ORAL | Status: DC | PRN
  Administered 2021-03-26: 25 ug via VAGINAL
  Filled 2021-03-26: qty 1

## 2021-03-26 MED ORDER — ONDANSETRON HCL 4 MG/2ML IJ SOLN: 4.0000 mg | INTRAMUSCULAR | Status: DC | PRN

## 2021-03-26 MED ORDER — EPHEDRINE 5 MG/ML INJ: 10.0000 mg | INTRAVENOUS | Status: DC | PRN

## 2021-03-26 MED ORDER — OXYTOCIN BOLUS FROM INFUSION
333.0000 mL | Freq: Once | INTRAVENOUS | Status: AC
  Administered 2021-03-26: 333 mL via INTRAVENOUS

## 2021-03-26 MED ORDER — ONDANSETRON HCL 4 MG/2ML IJ SOLN: 4.0000 mg | Freq: Four times a day (QID) | INTRAMUSCULAR | Status: DC | PRN

## 2021-03-26 MED ORDER — PHENYLEPHRINE 40 MCG/ML (10ML) SYRINGE FOR IV PUSH (FOR BLOOD PRESSURE SUPPORT): 80.0000 ug | PREFILLED_SYRINGE | INTRAVENOUS | Status: DC | PRN

## 2021-03-26 MED ORDER — MISOPROSTOL 200 MCG PO TABS
ORAL_TABLET | ORAL | Status: AC
  Administered 2021-03-26: 800 ug via RECTAL
  Filled 2021-03-26: qty 4

## 2021-03-26 MED ORDER — OXYCODONE-ACETAMINOPHEN 5-325 MG PO TABS: 1.0000 | ORAL_TABLET | ORAL | Status: DC | PRN

## 2021-03-26 MED ORDER — OXYCODONE HCL 5 MG PO TABS: 10.0000 mg | ORAL_TABLET | ORAL | Status: DC | PRN

## 2021-03-26 MED ORDER — IBUPROFEN 600 MG PO TABS
600.0000 mg | ORAL_TABLET | Freq: Four times a day (QID) | ORAL | Status: DC
  Administered 2021-03-26 – 2021-03-28 (×8): 600 mg via ORAL
  Filled 2021-03-26 (×8): qty 1

## 2021-03-26 MED ORDER — LACTATED RINGERS IV SOLN: 500.0000 mL | INTRAVENOUS | Status: DC | PRN

## 2021-03-26 MED ORDER — DIBUCAINE (PERIANAL) 1 % EX OINT: 1.0000 "application " | TOPICAL_OINTMENT | CUTANEOUS | Status: DC | PRN

## 2021-03-26 MED ORDER — FENTANYL-BUPIVACAINE-NACL 0.5-0.125-0.9 MG/250ML-% EP SOLN: 12.0000 mL/h | EPIDURAL | Status: DC | PRN

## 2021-03-26 MED ORDER — SIMETHICONE 80 MG PO CHEW: 80.0000 mg | CHEWABLE_TABLET | ORAL | Status: DC | PRN

## 2021-03-26 MED ORDER — OXYCODONE-ACETAMINOPHEN 5-325 MG PO TABS: 2.0000 | ORAL_TABLET | ORAL | Status: DC | PRN

## 2021-03-26 MED ORDER — OXYTOCIN-SODIUM CHLORIDE 30-0.9 UT/500ML-% IV SOLN
2.5000 [IU]/h | INTRAVENOUS | Status: DC
  Filled 2021-03-26: qty 500

## 2021-03-26 MED ORDER — BENZOCAINE-MENTHOL 20-0.5 % EX AERO: 1.0000 "application " | INHALATION_SPRAY | CUTANEOUS | Status: DC | PRN

## 2021-03-26 MED ORDER — TERBUTALINE SULFATE 1 MG/ML IJ SOLN: 0.2500 mg | Freq: Once | INTRAMUSCULAR | Status: DC | PRN

## 2021-03-26 MED ORDER — TETANUS-DIPHTH-ACELL PERTUSSIS 5-2.5-18.5 LF-MCG/0.5 IM SUSY: 0.5000 mL | PREFILLED_SYRINGE | Freq: Once | INTRAMUSCULAR | Status: DC

## 2021-03-26 MED ORDER — FENTANYL-BUPIVACAINE-NACL 0.5-0.125-0.9 MG/250ML-% EP SOLN
12.0000 mL/h | EPIDURAL | Status: DC | PRN
  Administered 2021-03-26: 12 mL/h via EPIDURAL

## 2021-03-26 MED ORDER — LACTATED RINGERS IV SOLN: INTRAVENOUS | Status: DC

## 2021-03-26 MED ORDER — ACETAMINOPHEN 325 MG PO TABS: 650.0000 mg | ORAL_TABLET | ORAL | Status: DC | PRN

## 2021-03-26 NOTE — H&P (Signed)
Stacey Woods is a 20 y.o. female G1P0 at 82 5/7 weeks (EDD 04/11/21 by LMP c/w 8 week [place]) presenting for IOL for finding of IUGR with EFW at 6% by MFM [place] 03/25/21 who recommended delivery. Dopplers and AFI WNL.  She had been followed for lagging [facility] since 32 weeks with antenatal testing and growth [place] q 3-4 weeks.  Prenatal care otherwise significant for anemia and gonorrhea treated with negative TOC and negative 36 week screen.  OB History     Gravida  1   Para      Term      Preterm      AB      Living         SAB      IAB      Ectopic      Multiple      Live Births             Past Medical History:  Diagnosis Date   Anemia    GC (gonococcus)    Gonorrhea 09/11/2020   Past Surgical History:  Procedure Laterality Date   SESAMOIDECTOMY Right 11/10/2019   Procedure: Right plantar plate repair;  Surgeon: Toni Arthurs, MD;  Location: [facility];  Service: Orthopedics;  Laterality: Right;   Family History: family history includes Cancer in her paternal grandmother; Hypertension in her mother. Social History:  reports that she quit smoking about 6 months ago. Her smoking use included cigarettes. She has never used smokeless tobacco. She reports that she does not drink alcohol and does not use drugs.     Maternal Diabetes: No Genetic Screening: Normal Maternal Ultrasounds/Referrals: IUGR Fetal Ultrasounds or other Referrals:  Referred to Materal Fetal Medicine  Maternal Substance Abuse:  No Significant Maternal Medications:  None Significant Maternal Lab Results:  Group B Strep negative Other Comments:  None  Review of Systems  Constitutional:  Negative for fever.  Gastrointestinal:  Negative for abdominal pain.  Genitourinary:  Negative for vaginal bleeding.  Maternal Medical History:  Contractions: Frequency: irregular.   Perceived severity is mild.   Fetal activity: Perceived fetal activity is normal.   Prenatal complications: IUGR,  anemia, gonorrhea Prenatal Complications - Diabetes: none.  Dilation: 2 Effacement (%): 90 Station: -1 Exam by:: Georgianne Fick, MD FHR category 1  Blood pressure 132/72, pulse 61, temperature 98.7 F (37.1 C), temperature source Oral, resp. rate 14, height 5\' 4"  (1.626 m), weight 79.8 kg, last menstrual period 07/05/2020. Maternal Exam:  Uterine Assessment: Contraction strength is moderate.  Contraction frequency is regular.  Abdomen: Patient reports no abdominal tenderness. Fetal presentation: vertex Introitus: Normal vulva. Normal vagina.   Physical Exam Cardiovascular:     Rate and Rhythm: Normal rate and regular rhythm.  Pulmonary:     Effort: Pulmonary effort is normal.  Abdominal:     Palpations: Abdomen is soft.  Genitourinary:    General: Normal vulva.  Neurological:     Mental Status: She is alert.  Psychiatric:        Mood and Affect: Mood normal.    Prenatal labs: ABO, Rh: --/--/B POS (12/13 0023) Antibody: NEG (12/13 0023) Rubella: Immune (06/20 0000) RPR:   NR HBsAg: Negative (06/20 0000)  HIV: Non-reactive (06/20 0000)  GBS:   Neg 03/20/2021 NIPT low risk Carrier screen negative AFP negative  One hour GCT 115  Assessment/Plan: Pt admitted for ripening and IOL.  She received cytotec x 1 and began to have frequent contractions that have increased  in pain.  Cervix is 90/2/-1 with BBOW. She is going to get epidural now as had little relief with stadol.     Oliver Pila 03/26/2021, 6:19 AM

## 2021-03-26 NOTE — Progress Notes (Signed)
Bleeding remained minimal, Dr. Adrian Blackwater instructed RN to d/c suction of Stacey Woods.  Monitored for an additional 30-20min and no further bleeding noted so Stacey Woods removed without difficulty.  Fundal rub by RN elicited no further bleeding and fundus was firm.  Will continue to monitor.

## 2021-03-26 NOTE — Anesthesia Preprocedure Evaluation (Signed)
Anesthesia Evaluation  Patient identified by MRN, date of birth, ID band Patient awake    Reviewed: Allergy & Precautions, H&P , NPO status , Patient's Chart, lab work & pertinent test results, reviewed documented beta blocker date and time   Airway Mallampati: I  TM Distance: >3 FB Neck ROM: full    Dental no notable dental hx. (+) Teeth Intact, Dental Advisory Given   Pulmonary neg pulmonary ROS, Patient abstained from smoking., former smoker,    Pulmonary exam normal breath sounds clear to auscultation       Cardiovascular negative cardio ROS Normal cardiovascular exam Rhythm:regular Rate:Normal     Neuro/Psych negative neurological ROS  negative psych ROS   GI/Hepatic negative GI ROS, Neg liver ROS,   Endo/Other  negative endocrine ROS  Renal/GU negative Renal ROS  negative genitourinary   Musculoskeletal   Abdominal   Peds  Hematology  (+) Blood dyscrasia, anemia ,   Anesthesia Other Findings   Reproductive/Obstetrics (+) Pregnancy                             Anesthesia Physical Anesthesia Plan  ASA: 2  Anesthesia Plan: Epidural   Post-op Pain Management:    Induction:   PONV Risk Score and Plan: 2  Airway Management Planned: Natural Airway  Additional Equipment: None  Intra-op Plan:   Post-operative Plan:   Informed Consent: I have reviewed the patients History and Physical, chart, labs and discussed the procedure including the risks, benefits and alternatives for the proposed anesthesia with the patient or authorized representative who has indicated his/her understanding and acceptance.     Dental Advisory Given  Plan Discussed with: Anesthesiologist and CRNA  Anesthesia Plan Comments: (Labs checked- platelets confirmed with RN in room. Fetal heart tracing, per RN, reported to be stable enough for sitting procedure. Discussed epidural, and patient consents to the  procedure:  included risk of possible headache,backache, failed block, allergic reaction, and nerve injury. This patient was asked if she had any questions or concerns before the procedure started.)        Anesthesia Quick Evaluation

## 2021-03-26 NOTE — Anesthesia Postprocedure Evaluation (Signed)
Anesthesia Post Note  Patient: ARASELI SHERRY  Procedure(s) Performed: AN AD HOC LABOR EPIDURAL     Patient location during evaluation: Mother Baby Anesthesia Type: Epidural Level of consciousness: awake and alert Pain management: pain level controlled Vital Signs Assessment: post-procedure vital signs reviewed and stable Respiratory status: spontaneous breathing, nonlabored ventilation and respiratory function stable Cardiovascular status: stable Postop Assessment: no headache, no backache and epidural receding Anesthetic complications: no   No notable events documented.  Last Vitals:  Vitals:   03/26/21 1045 03/26/21 1200  BP: 119/72 119/64  Pulse: 78 73  Resp: 17 18  Temp: 37.1 C 37.3 C  SpO2:  100%    Last Pain:  Vitals:   03/26/21 1200  TempSrc: Oral  PainSc: 0-No pain   Pain Goal:                   Alexsys Eskin

## 2021-03-26 NOTE — Anesthesia Procedure Notes (Signed)
Epidural Patient location during procedure: OB Start time: 03/26/2021 6:37 AM End time: 03/26/2021 6:42 AM  Staffing Anesthesiologist: Bethena Midget, MD  Preanesthetic Checklist Completed: patient identified, IV checked, site marked, risks and benefits discussed, surgical consent, monitors and equipment checked, pre-op evaluation and timeout performed  Epidural Patient position: sitting Prep: DuraPrep and site prepped and draped Patient monitoring: continuous pulse ox and blood pressure Approach: midline Location: L3-L4 Injection technique: LOR air  Needle:  Needle type: Tuohy  Needle gauge: 17 G Needle length: 9 cm and 9 Needle insertion depth: 8 cm Catheter type: closed end flexible Catheter size: 19 Gauge Catheter at skin depth: 14 cm Test dose: negative  Assessment Events: blood not aspirated, injection not painful, no injection resistance, no paresthesia and negative IV test

## 2021-03-27 LAB — CBC
HCT: 26.1 % — ABNORMAL LOW (ref 36.0–46.0)
Hemoglobin: 8.2 g/dL — ABNORMAL LOW (ref 12.0–15.0)
MCH: 24.3 pg — ABNORMAL LOW (ref 26.0–34.0)
MCHC: 31.4 g/dL (ref 30.0–36.0)
MCV: 77.4 fL — ABNORMAL LOW (ref 80.0–100.0)
Platelets: 185 10*3/uL (ref 150–400)
RBC: 3.37 MIL/uL — ABNORMAL LOW (ref 3.87–5.11)
RDW: 16.8 % — ABNORMAL HIGH (ref 11.5–15.5)
WBC: 14.6 10*3/uL — ABNORMAL HIGH (ref 4.0–10.5)
nRBC: 0 % (ref 0.0–0.2)

## 2021-03-27 NOTE — Progress Notes (Signed)
Post Partum Day #1 Subjective: no complaints, no heavy bleeding  Objective: Blood pressure 113/87, pulse 80, temperature 97.6 F (36.4 C), temperature source Oral, resp. rate 19, height 5\' 4"  (1.626 m), weight 79.8 kg, last menstrual period 07/05/2020, SpO2 100 %, unknown if currently breastfeeding.  Physical Exam:  General: alert Lochia: appropriate Uterine Fundus: firm  Recent Labs    03/26/21 0023 03/27/21 0605  HGB 9.9* 8.2*  HCT 32.2* 26.1*    Assessment/Plan: Continue routine care, bleeding stable since Jada removed   LOS: 1 day   03/29/21 03/27/2021, 9:03 AM

## 2021-03-28 MED ORDER — IBUPROFEN 600 MG PO TABS
600.0000 mg | ORAL_TABLET | Freq: Four times a day (QID) | ORAL | 0 refills | Status: DC | PRN
Start: 2021-03-28 — End: 2022-03-10

## 2021-03-28 NOTE — Discharge Summary (Signed)
Postpartum Discharge Summary  Date of Service updated 03/28/21     Patient Name: Stacey Woods DOB: [birth year] MRN: [id number]  Date of admission: 03/26/2021 Delivery date:03/26/2021  Delivering provider: Jacklyn Shell  Date of discharge: 03/28/2021  Admitting diagnosis: IUGR (intrauterine growth restriction) affecting care of mother, third trimester, fetus 1 [O36.5931] Intrauterine pregnancy: [redacted]w[redacted]d     Secondary diagnosis:  Principal Problem:   IUGR (intrauterine growth restriction) affecting care of mother, third trimester, fetus 1  Additional problems: Uterine atony    Discharge diagnosis: Term Pregnancy Delivered                                              Post partum procedures: none Augmentation: Cytotec Complications: None  Hospital course: Induction of Labor With Vaginal Delivery   20 y.o. yo G1P1001 at [redacted]w[redacted]d was admitted to the hospital 03/26/2021 for induction of labor.  Indication for induction:  fetal growth restriction .  Patient received 2 doses of cytotec and then rapidly progressed to complete.  Membrane Rupture Time/Date: 8:03 AM ,03/26/2021   Delivery Method:Vaginal, Spontaneous  Episiotomy: None  Lacerations:  None  Details of delivery can be found in separate delivery note.  She had uterine atony that responded to multiple uterotonics and jada placement.  Total EBL 700 mL  Upon removal of the jada, bleeding has been appropriate. Patient had a routine postpartum course. Patient is discharged home 03/28/21.  Newborn Data: Birth date:03/26/2021  Birth time:8:09 AM  Gender:Female  Living status:Living  Apgars:9 ,9  BTDVVO:1607 g    Transfusion:No  Physical exam  Vitals:   03/27/21 0534 03/27/21 1414 03/27/21 2127 03/28/21 0458  BP: 113/87 132/78 131/83 125/77  Pulse: 80 60 67 60  Resp: 19 16 18 16   Temp:  98.3 F (36.8 C) 98.3 F (36.8 C) 98.3 F (36.8 C)  TempSrc: Oral Oral Oral Oral  SpO2: 100% 100% 100% 100%  Weight:       Height:       General: alert, cooperative, and no distress Lochia: appropriate Uterine Fundus: firm Incision: n/a DVT Evaluation: No evidence of DVT seen on physical exam. Labs: Lab Results  Component Value Date   WBC 14.6 (H) 03/27/2021   HGB 8.2 (L) 03/27/2021   HCT 26.1 (L) 03/27/2021   MCV 77.4 (L) 03/27/2021   PLT 185 03/27/2021   No flowsheet data found. Edinburgh Score: Edinburgh Postnatal Depression Scale Screening Tool 03/27/2021  I have been able to laugh and see the funny side of things. 0  I have looked forward with enjoyment to things. 3  I have blamed myself unnecessarily when things went wrong. 0  I have been anxious or worried for no good reason. 0  I have felt scared or panicky for no good reason. 0  Things have been getting on top of me. 0  I have been so unhappy that I have had difficulty sleeping. 0  I have felt sad or miserable. 0  I have been so unhappy that I have been crying. 0  The thought of harming myself has occurred to me. 0  Edinburgh Postnatal Depression Scale Total 3      After visit meds:  Allergies as of 03/28/2021   No Known Allergies      Medication List     STOP taking these medications    aspirin 81  MG chewable tablet       TAKE these medications    ferrous sulfate 325 (65 FE) MG tablet Take 325 mg by mouth daily with breakfast.   ibuprofen 600 MG tablet Commonly known as: ADVIL Take 1 tablet (600 mg total) by mouth every 6 (six) hours as needed.   prenatal multivitamin Tabs tablet Take 1 tablet by mouth daily at 12 noon.         Discharge home in stable condition Infant Feeding: Bottle Infant Disposition:home with mother Discharge instruction: per After Visit Summary and Postpartum booklet. Activity: Advance as tolerated. Pelvic rest for 6 weeks.  Diet: routine diet Anticipated Birth Control: Unsure Postpartum Appointment:4 weeks  Future Appointments:No future appointments. Follow up Visit:   Follow-up Information     Philip Aspen, DO Follow up in 4 week(s).   Specialty: Obstetrics and Gynecology Contact information: [address] [place] [address] [phone]                     03/28/2021 [facility] Lizabeth Leyden, MD

## 2021-03-28 NOTE — Progress Notes (Signed)
Patient is doing well.  She is ambulating, voiding, tolerating PO.  Pain control is good.  Lochia is appropriate  Vitals:   03/27/21 0534 03/27/21 1414 03/27/21 2127 03/28/21 0458  BP: 113/87 132/78 131/83 125/77  Pulse: 80 60 67 60  Resp: 19 16 18 16   Temp:  98.3 F (36.8 C) 98.3 F (36.8 C) 98.3 F (36.8 C)  TempSrc: Oral Oral Oral Oral  SpO2: 100% 100% 100% 100%  Weight:      Height:        NAD Fundus firm Ext: No edema  Lab Results  Component Value Date   WBC 14.6 (H) 03/27/2021   HGB 8.2 (L) 03/27/2021   HCT 26.1 (L) 03/27/2021   MCV 77.4 (L) 03/27/2021   PLT 185 03/27/2021    --/--/B POS (12/13 0023)/RImmune  A/P 20 y.o. G1P1001 PPD#2. Routine care.   Iron deficiency anemia--continue po iron at discharge Meeting all goals.  Discharge to home today.   [facility]

## 2021-04-09 ENCOUNTER — Telehealth (HOSPITAL_COMMUNITY): Payer: Self-pay

## 2021-04-09 NOTE — Telephone Encounter (Signed)
"  I'm doing fine." Patient declines any questions or concerns about her healing.  "She is doing good. Eating and gaining weight. She sleeps in her bassinet." RN reviewed ABC's of safe sleep with patient. Patient declines any questions or concerns about baby.  EPDS score is 6.   Marcelino Duster [facility] 04/09/2021,1550

## 2022-03-10 ENCOUNTER — Ambulatory Visit (HOSPITAL_COMMUNITY)
Admission: EM | Admit: 2022-03-10 | Discharge: 2022-03-10 | Disposition: A | Discharge status: Home / Self Care | Payer: Medicaid Other | Attending: Emergency Medicine | Admitting: Emergency Medicine

## 2022-03-10 DIAGNOSIS — K0889 Other specified disorders of teeth and supporting structures: Secondary | ICD-10-CM | POA: Diagnosis not present

## 2022-03-10 DIAGNOSIS — K047 Periapical abscess without sinus: Secondary | ICD-10-CM

## 2022-03-10 MED ORDER — IBUPROFEN 800 MG PO TABS: 800.0000 mg | ORAL_TABLET | Freq: Four times a day (QID) | ORAL | 0 refills | Status: DC | PRN

## 2022-03-10 MED ORDER — ACETAMINOPHEN 325 MG PO TABS
975.0000 mg | ORAL_TABLET | Freq: Once | ORAL | Status: AC
  Administered 2022-03-10: 975 mg via ORAL

## 2022-03-10 MED ORDER — LIDOCAINE VISCOUS HCL 2 % MT SOLN
15.0000 mL | Freq: Once | OROMUCOSAL | Status: AC
  Administered 2022-03-10: 15 mL via OROMUCOSAL

## 2022-03-10 MED ORDER — ACETAMINOPHEN 325 MG PO TABS
ORAL_TABLET | ORAL | Status: AC
  Filled 2022-03-10: qty 3

## 2022-03-10 MED ORDER — LIDOCAINE VISCOUS HCL 2 % MT SOLN
OROMUCOSAL | Status: AC
  Filled 2022-03-10: qty 15

## 2022-03-10 MED ORDER — AMOXICILLIN-POT CLAVULANATE 875-125 MG PO TABS: 1.0000 | ORAL_TABLET | Freq: Two times a day (BID) | ORAL | 0 refills | Status: AC

## 2022-03-10 NOTE — Discharge Instructions (Addendum)
Please take medication as prescribed. Take with food to avoid upset stomach. Follow up with your dentist at your appointment next Tuesday.  Use ibuprofen (up to 800 mg) every 6 hours for pain You can alternate with 650 mg tylenol every 4 hours

## 2022-03-10 NOTE — ED Provider Notes (Signed)
[facility]    CSN: [id number] Arrival date & time: 03/10/22  1111     History   Chief Complaint Chief Complaint  Patient presents with   Dental Pain    HPI Stacey Woods is a 21 y.o. female.  Presents with 3-day history of dental pain Reports had 2 teeth pulled on the right about 6 months ago, was supposed to have some on the left pulled soon. Has a cracked tooth left bottom that has been causing pain, swelling Has tried ibuprofen and Tylenol, Percocet leftover from surgery Denies fevers. No trouble breathing or swallowing  Dentist appointment in 1 week  Past Medical History:  Diagnosis Date   Anemia    GC (gonococcus)    Gonorrhea 09/11/2020    Patient Active Problem List   Diagnosis Date Noted   IUGR (intrauterine growth restriction) affecting care of mother, third trimester, fetus 1 03/26/2021    Past Surgical History:  Procedure Laterality Date   SESAMOIDECTOMY Right 11/10/2019   Procedure: Right plantar plate repair;  Surgeon: Toni Arthurs, MD;  Location: [facility];  Service: Orthopedics;  Laterality: Right;    OB History     Gravida  1   Para  1   Term  1   Preterm      AB      Living  1      SAB      IAB      Ectopic      Multiple  0   Live Births  1            Home Medications    Prior to Admission medications   Medication Sig Start Date End Date Taking? Authorizing Provider  amoxicillin-clavulanate (AUGMENTIN) 875-125 MG tablet Take 1 tablet by mouth 2 (two) times daily for 5 days. 03/10/22 03/15/22 Yes Cadience Bradfield, Lurena Joiner, PA-C  ferrous sulfate 325 (65 FE) MG tablet Take 325 mg by mouth daily with breakfast.    [provider]  ibuprofen (ADVIL) 800 MG tablet Take 1 tablet (800 mg total) by mouth every 6 (six) hours as needed. 03/10/22   Ruvim Risko, Lurena Joiner, PA-C  Prenatal Vit-Fe Fumarate-FA (PRENATAL MULTIVITAMIN) TABS tablet Take 1 tablet by mouth daily at 12 noon.    [provider]     Family History Family History  Problem Relation Age of Onset   Hypertension Mother    Cancer Paternal Grandmother     Social History Social History   Tobacco Use   Smoking status: Former    Types: Cigarettes    Quit date: 08/28/2020    Years since quitting: 1.5   Smokeless tobacco: Never   Tobacco comments:    stopped smoking a week ago   Vaping Use   Vaping Use: Never used  Substance Use Topics   Alcohol use: Never   Drug use: Never     Allergies   Patient has no known allergies.   Review of Systems Review of Systems Per HPI  Physical Exam Triage Vital Signs ED Triage Vitals [03/10/22 1320]  Enc Vitals Group     BP 120/78     Pulse Rate 60     Resp 12     Temp 98 F (36.7 C)     Temp Source Oral     SpO2 100 %     Weight      Height      Head Circumference      Peak Flow  Pain Score      Pain Loc      Pain Edu?      Excl. in GC?    No data found.  Updated Vital Signs BP 120/78 (BP Location: Right Arm)   Pulse 60   Temp 98 F (36.7 C) (Oral)   Resp 12   LMP 02/24/2022   SpO2 100%   Physical Exam Vitals and nursing note reviewed.  Constitutional:      General: She is not in acute distress. HENT:     Mouth/Throat:     Mouth: Mucous membranes are moist. No oral lesions.     Dentition: Abnormal dentition. Dental tenderness and gingival swelling present.     Pharynx: Oropharynx is clear. No posterior oropharyngeal erythema.     Tonsils: No tonsillar exudate or tonsillar abscesses.      Comments: Cracked tooth with gingival swelling, tenderness Eyes:     Conjunctiva/sclera: Conjunctivae normal.  Cardiovascular:     Rate and Rhythm: Normal rate and regular rhythm.     Pulses: Normal pulses.     Heart sounds: Normal heart sounds.  Lymphadenopathy:     Cervical: No cervical adenopathy.  Skin:    General: Skin is warm and dry.  Neurological:     Mental Status: She is alert and oriented to person, place, and time.     UC  Treatments / Results  Labs (all labs ordered are listed, but only abnormal results are displayed) Labs Reviewed - No data to display  EKG  Radiology No results found.  Procedures Procedures (including critical care time)  Medications Ordered in UC Medications  acetaminophen (TYLENOL) tablet 975 mg (975 mg Oral Given 03/10/22 1446)  lidocaine (XYLOCAINE) 2 % viscous mouth solution 15 mL (15 mLs Mouth/Throat Given 03/10/22 1445)    Initial Impression / Assessment and Plan / UC Course  I have reviewed the triage vital signs and the nursing notes.  Pertinent labs & imaging results that were available during my care of the patient were reviewed by me and considered in my medical decision making (see chart for details).  Cracked tooth with possible dental infection Will treat with Augmentin twice daily for 5 days Discussed using ibuprofen/Tylenol for pain control. Gave Tylenol and lidocaine rinse in clinic with improvement of pain. She has dentist appointment next week, will follow-up with them regarding symptoms  Return precautions discussed. Patient agrees to plan  Final Clinical Impressions(s) / UC Diagnoses   Final diagnoses:  Dental infection  Pain, dental     Discharge Instructions      Please take medication as prescribed. Take with food to avoid upset stomach. Follow up with your dentist at your appointment next Tuesday.  Use ibuprofen (up to 800 mg) every 6 hours for pain You can alternate with 650 mg tylenol every 4 hours    ED Prescriptions     Medication Sig Dispense Auth. Provider   amoxicillin-clavulanate (AUGMENTIN) 875-125 MG tablet Take 1 tablet by mouth 2 (two) times daily for 5 days. 10 tablet Cera Rorke, PA-C   ibuprofen (ADVIL) 800 MG tablet Take 1 tablet (800 mg total) by mouth every 6 (six) hours as needed. 30 tablet Hallie Ishida, Lurena Joiner, PA-C      PDMP not reviewed this encounter.   Winfield Caba, Lurena Joiner, [place] 03/10/22 1517

## 2022-03-10 NOTE — ED Triage Notes (Signed)
Pt is here for dental pain x few days

## 2022-04-14 NOTE — L&D Delivery Note (Addendum)
OB/GYN Faculty Practice Delivery Note  Stacey Woods is a 22 y.o. G2P1001 s/p SVD at [redacted]w[redacted]d. She was admitted for IOL for FGR (<3%).   ROM: 4h 82m with Clear fluid GBS Status: Positive/-- (10/25 1219) Maximum Maternal Temperature: 98.8 F  Labor Progress: Patient presented to L&D for IOL for FGR. Initial SVE: 3/50%/-3. Patient was started on pitocin. AROM was performed at 0500. She then rapidly progressed to complete.   Delivery Date/Time: 1610 02/17/2023 Delivery: Called to room and patient was complete and pushing. Head delivered LOA with compound L arm. No nuchal cord present. Rest of body delivered in usual fashion. Infant with spontaneous cry, placed on mother's abdomen, dried and stimulated. Cord clamped x 2 after 1-minute delay, and cut by grandmother. Cord blood drawn. Placenta delivered spontaneously with gentle cord traction. Fundus firm with massage and Pitocin. Labia, perineum, vagina, and cervix inspected with hemostatic left labial, which was not repaired. A small first degree perineal laceration was repaired in the usual fashion. A periurethral laceration was noted and repaired while foley catheter was in place to ensure urethral patency. Placenta was examined after and found to be intact with normal 3-vessel cord. 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 Fundus firm, Cytotec was inserted rectally after delivery for prophylaxis given hx.  Placenta: Intact, 3 vessel cord, sent to pathology Complications: None Lacerations: Hemostatic 1st degree left labial not requiring repair; repaired 1st degree perineal and periurethral EBL: 110 mL Analgesia: Epidural  Postpartum Planning [x]  message to sent to schedule follow-up  [x]  vaccines UTD  Infant: APGARs 9/9  pending weight  Dimitry Sharion Dove, MD [facility] PGY-1 OB Faculty Practice 02/17/23 9:45 AM  Attestation of Supervision of Resident:  I confirm that I have verified the information documented in the resident's note  and that I was gloved and present throughout the delivery. I supervised Dr. Sharion Dove perform the delivery and repair of first degree perineal laceration; I performed the periurethral laceration repair.  I have verified that all services and findings are accurately documented in this resident's note; and I agree with management and plan as outlined in the documentation. I have also made any necessary editorial changes.   Sundra Aland, MD OB Fellow, Faculty Practice [facility], Center for [facility]

## 2022-07-10 ENCOUNTER — Ambulatory Visit (HOSPITAL_COMMUNITY)
Admission: EM | Admit: 2022-07-10 | Discharge: 2022-07-10 | Disposition: A | Discharge status: Home / Self Care | Payer: Medicaid Other | Attending: Physician Assistant | Admitting: Physician Assistant

## 2022-07-10 ENCOUNTER — Encounter (HOSPITAL_COMMUNITY): Payer: Self-pay

## 2022-07-10 DIAGNOSIS — Z3201 Encounter for pregnancy test, result positive: Secondary | ICD-10-CM | POA: Diagnosis not present

## 2022-07-10 DIAGNOSIS — R112 Nausea with vomiting, unspecified: Secondary | ICD-10-CM

## 2022-07-10 DIAGNOSIS — N926 Irregular menstruation, unspecified: Secondary | ICD-10-CM | POA: Diagnosis not present

## 2022-07-10 LAB — POCT URINALYSIS DIPSTICK, ED / UC
Glucose, UA: NEGATIVE mg/dL
Hgb urine dipstick: NEGATIVE
Ketones, ur: >=160 mg/dL — AB
Leukocytes,Ua: NEGATIVE
Nitrite: NEGATIVE
Protein, ur: NEGATIVE mg/dL
Specific Gravity, Urine: >=1.03 (ref 1.005–1.030)
Urobilinogen, UA: 1 mg/dL (ref 0.0–1.0)
pH: 6 (ref 5.0–8.0)

## 2022-07-10 LAB — POC URINE PREG, ED: Preg Test, Ur: POSITIVE — AB

## 2022-07-10 MED ORDER — PRENATAL COMPLETE 14-0.4 MG PO TABS: 1.0000 | ORAL_TABLET | Freq: Every day | ORAL | 1 refills | Status: DC

## 2022-07-10 MED ORDER — DOXYLAMINE-PYRIDOXINE 10-10 MG PO TBEC: 1.0000 | DELAYED_RELEASE_TABLET | Freq: Every day | ORAL | 1 refills | Status: DC

## 2022-07-10 NOTE — ED Provider Notes (Signed)
[facility]    CSN: [id number] Arrival date & time: 07/10/22  1510      History   Chief Complaint Chief Complaint  Patient presents with   Emesis    HPI Stacey Woods is a 22 y.o. female.   Patient presents today with a 24-hour history of nausea and vomiting.  Reports that yesterday she developed severe nausea and had recurrent emesis anytime she tried to eat or drink anything other than water.  She has been able to push fluids without recurrent emesis and has no concern for dehydration.  She denies any associated abdominal pain, melena, hematochezia, hematemesis, fever.  Reports her last menstrual cycle was 05/28/2022.  She does not take any kind of birth control and denies any recent antibiotics.  She has no specific concern for STI but is interested in testing.  She denies any suspicious food intake, antibiotic use, recent travel, known sick contacts.  She denies previous abdominal surgery and still has gallbladder and appendix.  Denies any significant past medical history.      Past Medical History:  Diagnosis Date   Anemia    GC (gonococcus)    Gonorrhea 09/11/2020    Patient Active Problem List   Diagnosis Date Noted   IUGR (intrauterine growth restriction) affecting care of mother, third trimester, fetus 1 03/26/2021    Past Surgical History:  Procedure Laterality Date   SESAMOIDECTOMY Right 11/10/2019   Procedure: Right plantar plate repair;  Surgeon: Wylene Simmer, MD;  Location: [facility];  Service: Orthopedics;  Laterality: Right;    OB History     Gravida  1   Para  1   Term  1   Preterm      AB      Living  1      SAB      IAB      Ectopic      Multiple  0   Live Births  1            Home Medications    Prior to Admission medications   Medication Sig Start Date End Date Taking? Authorizing Provider  Doxylamine-Pyridoxine 10-10 MG TBEC Take 1 tablet by mouth at bedtime. 07/10/22  Yes Abem Shaddix, Derry Skill,  PA-C  Prenatal Vit-Fe Fumarate-FA (PRENATAL COMPLETE) 14-0.4 MG TABS Take 1 tablet by mouth daily. 07/10/22  Yes Shomari Scicchitano, Derry Skill, PA-C    Family History Family History  Problem Relation Age of Onset   Hypertension Mother    Cancer Paternal Grandmother     Social History Social History   Tobacco Use   Smoking status: Former    Types: Cigarettes    Quit date: 08/28/2020    Years since quitting: 1.8   Smokeless tobacco: Never   Tobacco comments:    stopped smoking a week ago   Vaping Use   Vaping Use: Never used  Substance Use Topics   Alcohol use: Never   Drug use: Never     Allergies   Patient has no known allergies.   Review of Systems Review of Systems  Constitutional:  Negative for activity change, appetite change, fatigue and fever.  Gastrointestinal:  Positive for nausea and vomiting. Negative for abdominal pain and diarrhea.  Genitourinary:  Positive for menstrual problem. Negative for dysuria, frequency, urgency, vaginal bleeding, vaginal discharge and vaginal pain.     Physical Exam Triage Vital Signs ED Triage Vitals  Enc Vitals Group     BP 07/10/22 1642  116/71     Pulse Rate 07/10/22 1642 (!) 54     Resp 07/10/22 1642 14     Temp 07/10/22 1642 98.5 F (36.9 C)     Temp Source 07/10/22 1642 Oral     SpO2 07/10/22 1642 99 %     Weight --      Height --      Head Circumference --      Peak Flow --      Pain Score 07/10/22 1644 0     Pain Loc --      Pain Edu? --      Excl. in [place]? --    No data found.  Updated Vital Signs BP 116/71 (BP Location: Left Arm)   Pulse (!) 54   Temp 98.5 F (36.9 C) (Oral)   Resp 14   LMP 05/28/2022   SpO2 99%   Visual Acuity Right Eye Distance:   Left Eye Distance:   Bilateral Distance:    Right Eye Near:   Left Eye Near:    Bilateral Near:     Physical Exam Vitals reviewed.  Constitutional:      General: She is awake. She is not in acute distress.    Appearance: Normal appearance. She is  well-developed. She is not ill-appearing.     Comments: Very pleasant female appears stated age in no acute distress sitting comfortably in exam room  HENT:     Head: Normocephalic and atraumatic.  Cardiovascular:     Rate and Rhythm: Normal rate and regular rhythm.     Heart sounds: Normal heart sounds, S1 normal and S2 normal. No murmur heard. Pulmonary:     Effort: Pulmonary effort is normal.     Breath sounds: Normal breath sounds. No wheezing, rhonchi or rales.     Comments: Clear to auscultation bilaterally Abdominal:     General: Bowel sounds are normal.     Palpations: Abdomen is soft.     Tenderness: There is no abdominal tenderness. There is no right CVA tenderness, left CVA tenderness, guarding or rebound.     Comments: Benign abdominal exam  Psychiatric:        Behavior: Behavior is cooperative.      UC Treatments / Results  Labs (all labs ordered are listed, but only abnormal results are displayed) Labs Reviewed  POCT URINALYSIS DIPSTICK, ED / UC - Abnormal; Notable for the following components:      Result Value   Bilirubin Urine SMALL (*)    Ketones, ur >=160 (*)    All other components within normal limits  POC URINE PREG, ED - Abnormal; Notable for the following components:   Preg Test, Ur POSITIVE (*)    All other components within normal limits  URINE CULTURE  CERVICOVAGINAL ANCILLARY ONLY    EKG   Radiology No results found.  Procedures Procedures (including critical care time)  Medications Ordered in UC Medications - No data to display  Initial Impression / Assessment and Plan / UC Course  I have reviewed the triage vital signs and the nursing notes.  Pertinent labs & imaging results that were available during my care of the patient were reviewed by me and considered in my medical decision making (see chart for details).     Patient is well-appearing, afebrile, nontoxic, nontachycardic.  Vital signs and physical exam are reassuring today.   Urine pregnancy was positive. She is P2G1001 She was started on Diclegis to help with nausea and vomiting symptoms.  She was also started on prenatal vitamin.  Recommend that she eat small frequent meals and drink plenty of fluid.  She is to avoid over-the-counter medications as well as alcohol and tobacco products.  Discussed that she is not to raise her core body temperature and must eat fully cooked foods.  She is to follow-up with OB/GYN and was given contact information for local provider with instruction to call to schedule an appointment as soon as possible.  We discussed that if she has any changing symptoms including pelvic pain, abdominal pain, nausea, vomiting, bleeding she needs to be seen immediately.  Strict return precautions given.  All questions answered to patient satisfaction.  Final Clinical Impressions(s) / UC Diagnoses   Final diagnoses:  Positive pregnancy test  Nausea and vomiting, unspecified vomiting type  Missed menses     Discharge Instructions      You are pregnant.  Please start prenatal vitamins daily.  Take Diclegis at night to help with your nausea and vomiting.  Make sure you are drinking plenty of fluid and eat small frequent meals.  He will need to follow-up with OB/GYN as soon as possible.  Call them to schedule an appointment.  Do not take any over-the-counter medications until you see OB/GYN with the exception of Tylenol/acetaminophen.  Do not raise your core body temperature by visiting sauna or using a hot tub.  Make sure that all of your food is appropriately cooked.  Do not drink any alcohol or smoke cigarettes.  If you develop any abnormal bleeding, pelvic pain, nausea/vomiting interfering with oral intake, abdominal pain you need to be seen immediately.     ED Prescriptions     Medication Sig Dispense Auth. Provider   Prenatal Vit-Fe Fumarate-FA (PRENATAL COMPLETE) 14-0.4 MG TABS Take 1 tablet by mouth daily. 90 tablet Miyanna Wiersma K, PA-C    Doxylamine-Pyridoxine 10-10 MG TBEC Take 1 tablet by mouth at bedtime. 60 tablet Aliena Ghrist, Derry Skill, PA-C      PDMP not reviewed this encounter.   Terrilee Croak, PA-C 07/10/22 1740

## 2022-07-10 NOTE — Discharge Instructions (Signed)
You are pregnant.  Please start prenatal vitamins daily.  Take Diclegis at night to help with your nausea and vomiting.  Make sure you are drinking plenty of fluid and eat small frequent meals.  He will need to follow-up with OB/GYN as soon as possible.  Call them to schedule an appointment.  Do not take any over-the-counter medications until you see OB/GYN with the exception of Tylenol/acetaminophen.  Do not raise your core body temperature by visiting sauna or using a hot tub.  Make sure that all of your food is appropriately cooked.  Do not drink any alcohol or smoke cigarettes.  If you develop any abnormal bleeding, pelvic pain, nausea/vomiting interfering with oral intake, abdominal pain you need to be seen immediately.

## 2022-07-10 NOTE — ED Triage Notes (Signed)
Patient c/o N/V since yesterday. Patient states she did a home pregnancy last week and it was negative.

## 2022-07-11 LAB — CERVICOVAGINAL ANCILLARY ONLY
Bacterial Vaginitis (gardnerella): POSITIVE — AB
Candida Glabrata: NEGATIVE
Candida Vaginitis: POSITIVE — AB
Chlamydia: NEGATIVE
Comment: NEGATIVE
Comment: NEGATIVE
Comment: NEGATIVE
Comment: NEGATIVE
Comment: NEGATIVE
Comment: NORMAL
Neisseria Gonorrhea: NEGATIVE
Trichomonas: NEGATIVE

## 2022-07-11 LAB — URINE CULTURE: Culture: <10000 — AB

## 2022-07-12 ENCOUNTER — Telehealth (HOSPITAL_COMMUNITY): Payer: Self-pay | Admitting: Emergency Medicine

## 2022-07-12 MED ORDER — CLOTRIMAZOLE 1 % VA CREA: 1.0000 | TOPICAL_CREAM | Freq: Every day | VAGINAL | 0 refills | Status: DC

## 2022-07-12 MED ORDER — METRONIDAZOLE 0.75 % VA GEL: 1.0000 | Freq: Every day | VAGINAL | 0 refills | Status: AC

## 2022-09-08 ENCOUNTER — Encounter (HOSPITAL_COMMUNITY): Payer: Self-pay

## 2022-09-08 ENCOUNTER — Ambulatory Visit (HOSPITAL_COMMUNITY)
Admission: EM | Admit: 2022-09-08 | Discharge: 2022-09-08 | Disposition: A | Discharge status: Home / Self Care | Payer: Self-pay | Attending: Physician Assistant | Admitting: Physician Assistant

## 2022-09-08 ENCOUNTER — Inpatient Hospital Stay (HOSPITAL_COMMUNITY)
Admission: AD | Admit: 2022-09-08 | Discharge: 2022-09-08 | Disposition: A | Discharge status: Home / Self Care | Payer: Medicaid Other | Attending: Obstetrics & Gynecology | Admitting: Obstetrics & Gynecology

## 2022-09-08 DIAGNOSIS — H9202 Otalgia, left ear: Secondary | ICD-10-CM | POA: Diagnosis present

## 2022-09-08 DIAGNOSIS — Z3A14 14 weeks gestation of pregnancy: Secondary | ICD-10-CM

## 2022-09-08 DIAGNOSIS — O26892 Other specified pregnancy related conditions, second trimester: Secondary | ICD-10-CM | POA: Insufficient documentation

## 2022-09-08 DIAGNOSIS — H66002 Acute suppurative otitis media without spontaneous rupture of ear drum, left ear: Secondary | ICD-10-CM

## 2022-09-08 MED ORDER — AMOXICILLIN-POT CLAVULANATE 875-125 MG PO TABS: 1.0000 | ORAL_TABLET | Freq: Two times a day (BID) | ORAL | 0 refills | Status: DC

## 2022-09-08 NOTE — Discharge Instructions (Signed)
We are treating you for an ear infection.  Please take Augmentin twice daily for 7 days.  Use Tylenol for pain relief.  If your symptoms or not improving within a few days or if anything worsens please return for reevaluation including high fever, severe pain, drainage from the ear, weakness, nausea, vomiting.

## 2022-09-08 NOTE — MAU Provider Note (Signed)
[name] is a 22 y.o. G27P1001 female who presents to MAU today with complaint of left ear pain. Denies pregnancy concerns.  ROS: No VB No abd pain +ear pain  O BP (!) 114/59 (BP Location: Right Arm)   Pulse (!) 108   Temp 98.6 F (37 C) (Oral)   Resp 14   Ht 5\' 4"  (1.626 m)   Wt 68.9 kg   LMP 05/28/2022   SpO2 100%   BMI 26.06 kg/m  Physical Exam Constitutional:      General: She is not in acute distress.    Appearance: Normal appearance.  HENT:     Head: Normocephalic and atraumatic.  Pulmonary:     Effort: Pulmonary effort is normal. No respiratory distress.  Musculoskeletal:        General: Normal range of motion.     Cervical back: Normal range of motion.  Neurological:     General: No focal deficit present.     Mental Status: She is alert and oriented to person, place, and time.  Psychiatric:        Mood and Affect: Mood normal.        Behavior: Behavior normal.   FHT 153  MDM: No pregnancy emergency identified. Recommend evaluation at [facility] or with PCP.   A 1. Left ear pain   2. [redacted] weeks gestation of pregnancy    P Discharge from MAU in stable condition Patient may return to MAU as needed for pregnancy related complaints  Donette Larry, [place] 09/08/2022 12:22 PM

## 2022-09-08 NOTE — ED Provider Notes (Signed)
[facility]    CSN: [id number] Arrival date & time: 09/08/22  1232      History   Chief Complaint Chief Complaint  Patient presents with   Ear Pain    HPI Stacey Woods is a 22 y.o. female.   Patient presents today with a 3-day history of left otalgia.  She denies any additional symptoms including fever, nausea, vomiting, cough, congestion.  She reports pain is rated 6 on a 0-10 pain scale, described as throbbing, no aggravating leaving factors identified.  She denies any recent swimming or airplane travel.  Denies any recent illness or additional symptoms including cough or congestion.  She has tried Tylenol without improvement of symptoms.  Does not use earbuds, earplugs, Q-tips on a regular basis.  Denies history of recurrent ear infections.  Denies any recent antibiotics in the past 90 days.  She is currently pregnant at [redacted] weeks gestation.  She was seen by MAU who sent her to our clinic for additional evaluation.    Past Medical History:  Diagnosis Date   Anemia    GC (gonococcus)    Gonorrhea 09/11/2020    Patient Active Problem List   Diagnosis Date Noted   IUGR (intrauterine growth restriction) affecting care of mother, third trimester, fetus 1 03/26/2021    Past Surgical History:  Procedure Laterality Date   SESAMOIDECTOMY Right 11/10/2019   Procedure: Right plantar plate repair;  Surgeon: Toni Arthurs, MD;  Location: [facility];  Service: Orthopedics;  Laterality: Right;    OB History     Gravida  2   Para  1   Term  1   Preterm      AB      Living  1      SAB      IAB      Ectopic      Multiple  0   Live Births  1            Home Medications    Prior to Admission medications   Medication Sig Start Date End Date Taking? Authorizing Provider  amoxicillin-clavulanate (AUGMENTIN) 875-125 MG tablet Take 1 tablet by mouth every 12 (twelve) hours. 09/08/22  Yes Nayomi Tabron K, PA-C  Doxylamine-Pyridoxine  10-10 MG TBEC Take 1 tablet by mouth at bedtime. 07/10/22  Yes Marthella Osorno, Noberto Retort, PA-C  Prenatal Vit-Fe Fumarate-FA (PRENATAL COMPLETE) 14-0.4 MG TABS Take 1 tablet by mouth daily. 07/10/22  Yes Karon Cotterill, Noberto Retort, PA-C    Family History Family History  Problem Relation Age of Onset   Hypertension Mother    Cancer Paternal Grandmother     Social History Social History   Tobacco Use   Smoking status: Former    Types: Cigarettes    Quit date: 08/28/2020    Years since quitting: 2.0   Smokeless tobacco: Never   Tobacco comments:    stopped smoking a week ago   Vaping Use   Vaping Use: Never used  Substance Use Topics   Alcohol use: Never   Drug use: Never     Allergies   Patient has no known allergies.   Review of Systems Review of Systems  Constitutional:  Positive for activity change. Negative for appetite change, fatigue and fever.  HENT:  Positive for ear pain and hearing loss. Negative for congestion, sinus pressure, sneezing and sore throat.   Respiratory:  Negative for cough and shortness of breath.   Cardiovascular:  Negative for chest pain.  Gastrointestinal:  Negative for abdominal pain, diarrhea, nausea and vomiting.  Neurological:  Negative for dizziness, light-headedness and headaches.     Physical Exam Triage Vital Signs ED Triage Vitals [09/08/22 1336]  Enc Vitals Group     BP 103/60     Pulse Rate 96     Resp 16     Temp 98.1 F (36.7 C)     Temp Source Oral     SpO2 99 %     Weight      Height      Head Circumference      Peak Flow      Pain Score      Pain Loc      Pain Edu?      Excl. in [place]?    No data found.  Updated Vital Signs BP 103/60 (BP Location: Left Arm)   Pulse 96   Temp 98.1 F (36.7 C) (Oral)   Resp 16   LMP 05/28/2022   SpO2 99%   Visual Acuity Right Eye Distance:   Left Eye Distance:   Bilateral Distance:    Right Eye Near:   Left Eye Near:    Bilateral Near:     Physical Exam Vitals reviewed.  Constitutional:       General: She is awake. She is not in acute distress.    Appearance: Normal appearance. She is well-developed. She is not ill-appearing.     Comments: Very pleasant female appears stated age no acute distress sitting comfortably in exam room  HENT:     Head: Normocephalic and atraumatic.     Right Ear: Tympanic membrane, ear canal and external ear normal. Tympanic membrane is not erythematous or bulging.     Left Ear: Ear canal and external ear normal. Tympanic membrane is injected and bulging. Tympanic membrane is not erythematous.     Nose:     Right Sinus: No maxillary sinus tenderness or frontal sinus tenderness.     Left Sinus: No maxillary sinus tenderness or frontal sinus tenderness.     Mouth/Throat:     Pharynx: Uvula midline. No oropharyngeal exudate or posterior oropharyngeal erythema.  Cardiovascular:     Rate and Rhythm: Normal rate and regular rhythm.     Heart sounds: Normal heart sounds, S1 normal and S2 normal. No murmur heard. Pulmonary:     Effort: Pulmonary effort is normal.     Breath sounds: Normal breath sounds. No wheezing, rhonchi or rales.     Comments: Clear to auscultation bilaterally Psychiatric:        Behavior: Behavior is cooperative.      UC Treatments / Results  Labs (all labs ordered are listed, but only abnormal results are displayed) Labs Reviewed - No data to display  EKG   Radiology No results found.  Procedures Procedures (including critical care time)  Medications Ordered in UC Medications - No data to display  Initial Impression / Assessment and Plan / UC Course  I have reviewed the triage vital signs and the nursing notes.  Pertinent labs & imaging results that were available during my care of the patient were reviewed by me and considered in my medical decision making (see chart for details).     Patient is well-appearing, afebrile, nontoxic, nontachycardic.  Otitis media identified on physical exam.  Will treat with  Augmentin twice daily for 7 days as she is not close to delivery.  Recommend she continue Tylenol for pain relief.  Discussed that if her symptoms or  not improving within a few days she should return for reevaluation.  If she has any worsening symptoms including high fever, severe pain, otorrhea, weakness, nausea, vomiting she needs to be seen immediately.  Strict return precautions given.  Final Clinical Impressions(s) / UC Diagnoses   Final diagnoses:  Non-recurrent acute suppurative otitis media of left ear without spontaneous rupture of tympanic membrane  [redacted] weeks gestation of pregnancy     Discharge Instructions      We are treating you for an ear infection.  Please take Augmentin twice daily for 7 days.  Use Tylenol for pain relief.  If your symptoms or not improving within a few days or if anything worsens please return for reevaluation including high fever, severe pain, drainage from the ear, weakness, nausea, vomiting.     ED Prescriptions     Medication Sig Dispense Auth. Provider   amoxicillin-clavulanate (AUGMENTIN) 875-125 MG tablet Take 1 tablet by mouth every 12 (twelve) hours. 14 tablet Ensley Blas, Noberto Retort, PA-C      PDMP not reviewed this encounter.   Jeani Hawking, PA-C 09/08/22 1405

## 2022-09-08 NOTE — MAU Note (Addendum)
.  Stacey Woods is a 22 y.o. at [redacted]w[redacted]d here in MAU reporting: left ear pain that started x2 days ago, states it feels like when you have swimmers ear, but she has not been swimming. Denies pregnancy concerns, has not been anywhere to be seen yet. Denies VB or LOF.   Pain score: 6 Vitals:   09/08/22 1211  BP: (!) 114/59  Pulse: (!) 108  Resp: 14  Temp: 98.6 F (37 C)  SpO2: 100%     FHT:153

## 2022-09-08 NOTE — ED Triage Notes (Signed)
Patient is [redacted] weeks pregnant; presents with Left ear pain x 3 days.

## 2022-10-14 ENCOUNTER — Telehealth (INDEPENDENT_AMBULATORY_CARE_PROVIDER_SITE_OTHER): Payer: Self-pay

## 2022-10-14 DIAGNOSIS — Z3492 Encounter for supervision of normal pregnancy, unspecified, second trimester: Secondary | ICD-10-CM

## 2022-10-14 DIAGNOSIS — Z3490 Encounter for supervision of normal pregnancy, unspecified, unspecified trimester: Secondary | ICD-10-CM | POA: Insufficient documentation

## 2022-10-14 DIAGNOSIS — O365990 Maternal care for other known or suspected poor fetal growth, unspecified trimester, not applicable or unspecified: Secondary | ICD-10-CM

## 2022-10-14 DIAGNOSIS — Z3689 Encounter for other specified antenatal screening: Secondary | ICD-10-CM

## 2022-10-14 DIAGNOSIS — O99019 Anemia complicating pregnancy, unspecified trimester: Secondary | ICD-10-CM | POA: Insufficient documentation

## 2022-10-14 HISTORY — DX: Maternal care for other known or suspected poor fetal growth, unspecified trimester, not applicable or unspecified: O36.5990

## 2022-10-14 HISTORY — DX: Anemia complicating pregnancy, unspecified trimester: O99.019

## 2022-10-14 NOTE — Progress Notes (Signed)
New OB Intake  I connected with Stacey Woods  on 10/14/22 at 11:15 AM EDT by MyChart Video Visit and verified that I am speaking with the correct person using two identifiers. Nurse is located at [facility] and pt is located at home.  I discussed the limitations, risks, security and privacy concerns of performing an evaluation and management service by telephone and the availability of in person appointments. I also discussed with the patient that there may be a patient responsible charge related to this service. The patient expressed understanding and agreed to proceed.  I explained I am completing New OB Intake today. We discussed EDD of 03/04/2023, by Last Menstrual Period. Pt is G2P1001. I reviewed her allergies, medications and Medical/Surgical/OB history.    Patient Active Problem List   Diagnosis Date Noted   IUGR (intrauterine growth restriction) affecting care of mother, third trimester, fetus 1 03/26/2021    Concerns addressed today  Delivery Plans Plans to deliver at [facility]. Discussed the nature of our practice with multiple providers including residents and students. Due to the size of the practice, the delivering provider may not be the same as those providing prenatal care.   Patient is not interested in water birth. Offered upcoming OB visit with CNM to discuss further.  MyChart/Babyscripts MyChart access verified. I explained pt will have some visits in office and some virtually. Babyscripts instructions given and order placed. Patient verifies receipt of registration text/e-mail. Account successfully created and app downloaded.  Blood Pressure Cuff/Weight Scale Patient is self-pay; explained patient will be given BP cuff at first prenatal appt. Explained after first prenatal appt pt will check weekly and document in Babyscripts.  Is patient a [facility] candidate?  Not a candidate   If accepted, confirm patient does not intend to move from the area for at  least 12 months, then notify Mom[facility]Baby staff  Interested in [facility]? If yes, send referral and doula dot phrase.    First visit review I reviewed new OB appt with patient. Explained pt will be seen by Dr. Shawnie Pons at first visit. Discussed Avelina Laine genetic screening with patient.  [facility] and [employer].. Routine prenatal labs is needed at new ob visit.  Last Pap No results found for: "DIAGPAP"  Lowry Bowl, CMA 10/14/2022  11:28 AM

## 2022-10-14 NOTE — Addendum Note (Signed)
Addended by: Brien Mates T on: 10/14/2022 04:08 PM   Modules accepted: Orders

## 2022-10-14 NOTE — Patient Instructions (Signed)
Safe Medications in Pregnancy   Acne:  Benzoyl Peroxide  Salicylic Acid   Backache/Headache:  Tylenol: 2 regular strength every 4 hours OR               2 Extra strength every 6 hours   Colds/Coughs/Allergies:  Benadryl (alcohol free) 25 mg every 6 hours as needed  Breath right strips  Claritin  Cepacol throat lozenges  Chloraseptic throat spray  Cold-Eeze- up to three times per day  Cough drops, alcohol free  Flonase (by prescription only)  Guaifenesin  Mucinex  Robitussin DM (plain only, alcohol free)  Saline nasal spray/drops  Sudafed (pseudoephedrine) & Actifed * use only after [redacted] weeks gestation and if you do not have high blood pressure  Tylenol  Vicks Vaporub  Zinc lozenges  Zyrtec   Constipation:  Colace  Ducolax suppositories  Fleet enema  Glycerin suppositories  Metamucil  Milk of magnesia  Miralax  Senokot  Smooth move tea   Diarrhea:  Kaopectate  Imodium A-D   *NO pepto Bismol   Hemorrhoids:  Anusol  Anusol HC  Preparation H  Tucks   Indigestion:  Tums  Maalox  Mylanta  Zantac  Pepcid   Insomnia:  Benadryl (alcohol free) 25mg every 6 hours as needed  Tylenol PM  Unisom, no Gelcaps   Leg Cramps:  Tums  MagGel   Nausea/Vomiting:  Bonine  Dramamine  Emetrol  Ginger extract  Sea bands  Meclizine  Nausea medication to take during pregnancy:  Unisom (doxylamine succinate 25 mg tablets) Take one tablet daily at bedtime. If symptoms are not adequately controlled, the dose can be increased to a maximum recommended dose of two tablets daily (1/2 tablet in the morning, 1/2 tablet mid-afternoon and one at bedtime).  Vitamin B6 100mg tablets. Take one tablet twice a day (up to 200 mg per day).   Skin Rashes:  Aveeno products  Benadryl cream or 25mg every 6 hours as needed  Calamine Lotion  1% cortisone cream   Yeast infection:  Gyne-lotrimin 7  Monistat 7    **If taking multiple medications, please check labels to avoid  duplicating the same active ingredients  **take medication as directed on the label  ** Do not exceed 4000 mg of tylenol in 24 hours  **Do not take medications that contain aspirin or ibuprofen            [place] Pediatric Providers  [place] ([address]) [facility] Brown, MD; Chambliss, MD; Eniola, MD; Hensel, MD; McDiarmid, MD; McIntyer, MD [address] [place], [place] [address] [phone] Mon-Fri 8:30-12:30, 1:30-5:00  Providers come to see babies during newborn hospitalization Only accepting infants of Mother's who are seen at Family Medicine Center or have siblings seen at   Family [facility] Center Medicaid - Yes; Tricare - Yes   [facility] Mulberry, MD [address] [place], [place] [address] [phone] Mon, Tue, Thur, Fri 8:30-5:00, Wed 10:00-7:00 (closed 1-2pm daily for lunch) Takes [place] residents with no insurance.  [place] [facility] only with Medicaid/insurance; Tricare - no  [facility] Ben-Davies, MD; Brown, MD; Chandler, MD; Ettefagh, MD; Grant, MD; Hanvey, MD; Herrin, MD; Jones,  MD; Lester, MD; McCormick, MD; McQueen, MD; Simha, MD; Stanley, MD; Stryffeler, NP [address], ,  [address] [phone] Mon, Tue, Thur, Fri 8:30-5:30, Wed 9:30-5:30, Sat 8:30-12:30 Only accepting infants of first-time parents or siblings of current   patients Hospital discharge coordinator will make follow-up appointment Medicaid - yes; Tricare - yes  [place] ([address]) [facility] Cox, MD; Davis, MD; Dovico, MD; Ettefaugh, MD; Lowe, MD; Nation, MD; Slimp, MD; Sumner, MD; Williams, MD [address], [place], [place] [address] [phone] Mon-Fri 8:30-5:00, closed for lunch 12:30-1:30; Sat-Sun 10:00-1:00 Accepting Newborns with commercial insurance only, must call prior to  delivery to be accepted into  practice.  Medicaid - no, Tricare - yes   [facility] [address] [place], [place] [address] [phone] or [phone] Mon to Fri 8am to 10pm, Sat 8am to 1pm (virtual only on weekends) Only accepts Medicaid Healthy Blue pts  [facility]  Artis, MD; Coccaro, MD; [place] Gardner, MD; Netherton, NP; Roper, MD; Wilmot, PA-C; Skinner, MD [address] [place], [place] [address] [phone] Mon-Fri 8:30-5:30 Medicaid - yes, Tricare - yes  [place] ([address]) [facility] Warner, MD [address], [place], [place] [address] [phone] Mon, Tues, Wed Fri 8:30-5:00, Sat 8:30-12:00, Closed Thursdays Accepting siblings of established patients and first time mom's if you call prenatally Medicaid- yes; Tricare - yes  [facility] Becker, PA; Hagler, MD; Quinn, PA-C; Scifres, PA; Sun, MD; Swayne, MD;  [address], [place], [place] [address] [phone] Mon-Fri 8:30-5:00, closed for lunch 1-2 Only accepting newborns of established patients Medicaid- no; Tricare - yes  [place] ([address]) [facility] Timberlake, MD; [address], [place], [place] [address] [phone] Mon-Fri 8:00-5:00 Medicaid - No; Tricare - Yes  [facility] [employer]  Brake, NP; Wharton, PA [address], [place], [place] [address] [phone] Mon-Fri 8:00-5:00 Medicaid - No, Tricare - Yes  [facility] Gay, MD; Quinlan, MD; Blatt, DNP [address] [place], [place] [address] [phone]  Mon-Fri 8:00-5:00 Medicaid - No; Tricare - Yes  [facility] [address] [place], [place] [address] [phone] Mon-Fri 8:30-5:00 (lunch 12:00-1:00) Medicaid -Yes; Tricare - Yes  [facility] Jordan, MD [address],  [place], [place] [address] [phone] Mon-Fri 8:00-5:00 Seeing newborns of current patients only. No new patients Medicaid - No, Tricare - yes  [facility] Parker, MD [address] [place], [place] [address] [phone] Mon-Fri 8:00-5:00 Medicaid -yes as secondary coverage only; Tricare - yes  [facility] Brecken, PA; Christy, NP; Dees, MD; DeClaire, MD; DeWeese, MD; Hodge, PA; Smoot, NP; Summer, MD; Vapne, MD [address] [place], [place] [address] [phone] Mon-Fri 8:30-5:00, Sat 9:00-11:00 Accepts commercial insurance ONLY. Offers free prenatal information sessions for families. Medicaid - No, Tricare - Call first  [facility] Bouska, MD; Gordon, PA; Jeffery, PA; Weber, PA [address] [place] [address] [phone] Mon-Fri 7:30-5:30 Medicaid - Yes; Tricare - yes  [place] ([address] & [address])  [facility] Reese, MD [address] [place], [place] [address] [phone] Mon-Thur 8:00-6:00, closed for lunch 12-2, closed Fridays Medicaid - yes; Tricare - no  [facility] Anderson, NP; Badger, MD; Beal, PA; Spencer, PA [address], [place],  [address] [phone] Mon-Fri 7:30-4:30 Medicaid - yes, Tricare - yes  [facility]  Agbuya, MD; Klett, NP; Romgoolam, MD; Rothstein, NP [address], [place],  [address] [phone] Mon-Fri 8:30-5:00, closed for lunch 1-2, Sat 8:30-12:00 - sick visits only Providers come to see   babies at [facility] Only accepting newborns of siblings and first time parents ONLY if who have met with office prior to delivery Medicaid -Yes; Tricare - yes  [facility] [place]  [place], DO; Friddle, NP; Wallace, MD; Wood, MD:  [address], [place], [place] [address] [phone] Mon- Fri 8:00-5:00, Sat 9:00-12:00 - sick  visits only Accepting siblings of established patients and first time mom/baby Medicaid - Yes; Tricare - yes Patients must have vaccinations (baby vaccines)  [place] ([address] & [address])  [facility] [facility] [address] [place], [place] [address] [phone] Mon-Fri 8:00-5:00 Medicaid - no; Tricare - yes  [facility] Briscoe, MD; Schmidt, PA; Moreira, PA [address], [place], [place] [address] [phone] Mon-Fri 8:00-5:00 Medicaid- yes; Tricare - yes  [facility] Boyd, MD; Jones, NP; Osborn, PA [address], [place], [place] [address] [phone] Mon-Fri 8:00-5:00 Medicaid - Yes; Tricare - yes  [place] ([address])  [facility] Atkinson, PA; Calderon, PA; Cummings, MD; Dillard, MD; Henrish, NP; Isenhour, DO; Martin, PA; Olson, MD; Ott, MD; Phillips, MD; Valente, PA; VanDeven, PA; Yonjof, NP [address], [place], [place] [address] [phone] Mon-Fri 8:30-5:00, Sat 9:00-12:00 - sick only Please register online [email or web address] then schedule online or call office Medicaid-Yes; Tricare -yes  [facility]  Dabrusco, MD; Dial, MD; Dowagiac, MD; Fleenor, NP; Goolsby, PA; Tonuzi, MD; Turner, NP; West, MD [address], [place], [place] [address] [phone] Mon-Fri 8:00-5:30, Sat&Sun by appointment (phones open at 8:30) Medicaid - Yes; Tricare - yes  [place] ([address]) [facility] Allen, CPNP; Bates, MD; Gordon, MD; Mills, NP; Weinshilboum, DO [address], [place], [place] [address] [phone] M-F 8:00 - 5:15, Sat/Sun 9-12 sick visits only Medicaid - No; Tricare - yes  [facility] [place] [facility]  Brown, PA-C; Cowen, PA-C; Dennis, DO; Fuster, PA-C; Martin, PA-C; Shelton,  PA-C; Spry, MD [address] [place], [place] [address] [phone] Mon-Thur 8:00-7:00, Fri 8:00-5:00 Accepting Medicaid for 13 and under only   [facility] [place] [facility] [place] Hayes, FNP; List, FNP; Moran, MD; Pitonzo, PA-C; Scholer, MD; Spangle, FNP; Nzenwa, FNP; Jasper, MD; Moran, MD [address] [place], [place] [address] [phone] Mon-Fri 8:30-5:30 Medicaid - Yes; Tricare - yes  [facility] [address]  Kelly, CPNP; Logan, MD; Poth, MD; Ramadoss, MD; Staton, NP [address], [place], [place] [address] [phone] Mon-Thur 8:00-5:30, Fri 8:00-5:00, Sat 9:00-12:00 Medicaid - yes, Tricare - yes  [place] ([address])  [facility] [place] Masneri, DO; Meyers, MD; Nelson, PA [address], [place], [place] [address] [phone] Mon-Fri 8:00-5:00, closed for lunch 12-1 Medicaid - No; Tricare - yes  [facility] [place] McGowen, MD [address], [place], [place] [address] [phone] Mon-Fri 8:00-5:00 Medicaid - No; Tricare - yes  [facility] [place] MacDonald, MD; Nayak, MD; Kearns, MD; Jones, MD [address] [place] [address], [place], [place] [address] [phone] Mon-Fri 8:00-5:00 Medicaid- Yes; Tricare - yes  [place] ([address])  [facility] [place], PA-C; Tabori, MD [address], [place], [place] [address] [phone] Mon-Fri 8:00-5:00 Medicaid - No; Tricare - yes  [facility] [place]  Margin - CPNP [address], [place], [place] [address] [phone] Mon-Weds 8:00-6:00, Thurs-Fri 8:00-5:00, Sat 9:00-12:00 Medicaid - yes; Tricare - yes   [facility] [place] Aubuchon, MD; Brandon, PA [address] [place], [place] [address] [phone] Mon-Fri 8:00-5:00 Medicaid - yes; Tricare - yes  [place]  Pediatric Providers  [facility] [address], [place], [place] [address] [phone] M, Thur: 8am -8pm, Tues, Weds: 8am - 5pm; Fri: 8-1 Medicaid - Yes; Tricare - yes  [facility] Mertz, MD; Johnson, MD; Wells, MD; Downs, PA; Hockenberger, PA [address], [place], [place] [address] [phone] M-F 8:30 - 5:00 Medicaid - Call office; Tricare -yes  [facility] Bonney, MD; Page, MD, Minter, MD; Mueller, PNP; Thomason, NP [address], [place], [place] [address] [phone] M-F 8:30 - 5:00, Sat/Sun 8:30 - 12:30 (sick visits) Medicaid - Call office; Tricare -yes  [facility] Lewis, MD; Shaub, PNP; Boylston, MD; Quaile, PA; Nonato, NP; Landon, CPNP [address], [place], [place] [address] [phone] M-F 8:30 - 5:00 Medicaid - Call office; Tricare - yes  [facility] [place] Cline, MD; Dvergsten, MD; Flores, MD; Kawatu, MD; Nogo, MD [address], [place], [place] [address] [phone] M-Thur: 8:00 - 5:00; Fri: 8:00 - 4:00 Medicaid - yes; Tricare - yes  [facility] [address] [place], [place], [place] [address] [phone] M-F: 8:30- 5:00, closed for lunch 12:30 - 1:00 Medicaid - yes; Tricare -yes  [facility] [place] [address], [place], [place] [address] [phone] M-F 8:00 - 5:00 Medicaid - yes; Tricare - yes  [facility] Johnson, DO; Rumball, DO; Wicker, NP [address] [place], [place], [place] [address] [phone] M-F 8:00 - 5:00, Closed 12-1 for lunch Medicaid - Call; Tricare - yes  [facility] Stein, MD [address], [place], [place] [address] [phone] M-F: 8:00-5:00, Sat: 8:00 - noon Medicaid - call; Tricare -yes  [place] Pediatric Providers  [facility] Collins, FNP-C [address], [place], [place] [address] [phone] M-W: 8:00-5:00, Thur: 8:00 -  7:00, Fri: 8:00 - noon Medicaid - yes; Tricare - yes  [facility] [place] Adams, FNP [address], [place], [place] [address] [phone] M-F 8:00 - 5:00, Closed for lunch 12-1 Medicaid - yes; Tricare - yes  [place] Pediatric Providers  [facility] Smith, FNP, Melvin, MD, Fay, FNP-C [address] [place] [address] [place], [place], [place] [address] [phone] M-T 8:00-5:00, Wed-Fri 7:00-6:00 Medicaid - Yes; Tricare -yes  [facility] [place] Civiletti, DO; [address], [place], [place]  [address] [phone] M-F 8:00 - 5:00, closed for lunch 12-1 Medicaid - Yes; Tricare - yes  [facility]  Barnes, MD; Bergdolt, MD; Caulfield, MD; Emrich, MD; Fiscus, MD; Hoppens, MD; Kylstra, MD, McPherson, MD; Todd, MD; Prestwood, MD; Waters, MD; Wood, MD [address], [place], [place] [address] [phone] M-F 8:00-5:00 Medicaid - yes; Tricare - yes  [facility] Cheema, MD (speaks Punjabi and Hindi) [address] [place],  [address] [phone] M-F: 8:30 - 5:00, closed 12:30 - 1 for lunch Medicaid - Yes; Tricare -yes  [place] Pediatric Providers  [facility] Loda, MD; Timberlake, MD; Burke,   MD [address], [place], [place] [address] [phone] M-Th: 8:00 - 5:30, Fri: 8:00 - 12:00 Medicaid - yes; Tricare - yes  [facility] [place] Lookabill, NP; Meier, MD; Daffron, MD [address], [place], [place] [address] [phone] M-F: 8:00 - 5:00 Medicaid - yes; Tricare - yes  [facility] Busse, NP; Bowman, NP; Baune, NP; Entwistle, MD; Williams, MD, Huffman, NP, Ferguson, MD; Patel, DO [address], [place], [place] [address] [phone] M-F: 8:30 - 5:30p Medicaid - yes; Tricare - yes Other locations available as well  [place] [facility] Rajan, MD; Wilson, MD; Morgan, PA-C, Domenech, PA-C; Myers, PA-C [address], [place], [place] [address] [phone] M-W: 8:00am - 7:00pm, Thurs: 8:00am - 8:00pm; Fri: 8:00am - 5:00pm, closed daily from 12-1 for lunch Medicaid - yes; Tricare - yes  [place] Pediatric Providers  [facility] [place] Adams, MD; Crystal, FNP; Hadley, MD; Stokes, MD; Johnson, PNP; Brady, PA-C; West, PNP; Gardner, MD;  [address], [place], [place] [address] [phone] M - Fri: 8am - 5pm, Sat 9-noon Medicaid - Yes; Tricare -yes  [facility] [place] Nayak, MD; Jones, FNP; McDonald, MD; Kearns, MD [address] [place] [address], [place], [place] [phone] M-F 8:00 - 5:00 Medicaid - call; Tricare - yes  [facility] [place] Bell, MD; Emory, PNP; Pinder, MD; Anderson, MD; Light, PA-C; Johnson, MD; Latta, MD; Saul, PNP; Rainey, MD; Clifford, MD; McClung, MD [address], [place], [place] [address] [phone] M-F 8:00am - 5:00pm; Sat. 9:00 - 11:00 Medicaid - yes; Tricare - yes  [facility] [place] Soldato-Couture, MD [address], [place], [place] [address] [phone] M-F 8:00 - 5:00 Medicaid - [place] Medicaid only; Tricare - yes  [facility] [place] Walker, MD; Davis, PNP; Ajizian, MD [address] [place] [address], [place], [place] [address] [phone] M-F 8:00 - 5:00 Medicaid - yes; Tricare - yes  [facility] [place] Barry, MD; Brown, MD, Forest, MD, Hazek, MD; Hoyle, MD; Smith, MD; [address], [place], [place] [address] [phone] M-F: 8-5 Medicaid - yes; Tricare - yes  [facility] [place] Brady, Md; Dowlen, MD; [address] [place] [address], [place], [place] [address] [phone] M-F 8-5 Medicaid - yes; Tricare - yes  [facility] Kearns, MD; Nayak, MD; Soldato-Courture, MD; Pellam-Palmer, DNP;  Herring, PNP [address], [place], [place] [address] [phone] M-F 8-5 Medicaid - yes; Tricare - yes  [facility] Weathers, MD; Merritt, PA-C; Davis-PA-C; Warnimont, MD [address], [place], [place] [address] [phone] M-F 7am - 5 pm Medicaid - call; Tricare - yes  [facility] Hill, PNP; Erickson, MD; Robinson, MD [address], [place], [place] [address] [phone] M-F 8-5 Medicaid - yes; Tricare - yes  [facility] Kribbs, MD; Warner, MD; Williams, FNP; Brooks, FNP; Boles, FNP; Romblad, PA-C; [place] - FNP [address], [place],  [address] [phone] M-F 8-5 Medicaid- yes; Tricare - yes  [facility] [place] [place] Yoder, MD; Verenes, MD; Armentrout, MD; Stewart, MD; Beasley, CPNP; Ford, MD; Erickson, MD; Rice, MD [address], [place],  [address] [phone] M-F: 8-5, Sat: 9-4, Sun 9-12 Medicaid - yes; Tricare - yes  [facility] Little, PNP; Davis, PNP [address], [place],   [place] [address] [phone] M-F 8 - 5, closed 12-1 for lunch Medicaid - yes; Tricare - yes  [facility] Friesen, MD; Cnegia, MD; Rice, MD; Patel, DO [address], [place], [place] [address] [phone] M-F 8- 5:30 Medicaid - yes; Tricare - yes  [facility] [place] Zvolensky, MD; Ray, MD; Haas, MD [address], [place], [place] [address] [phone] M: 8-7; Tues-Fri: 8-5; Sat: 9-12 Medicaid - yes; Tricare - yes  [facility] [place] Heinrich, MD; Meyer, MD; Clark, MD; Rhyne, MD; Aubuchon, MD [address], [place], [place] [address] [phone] M: 8-7; Tues-Fri: 8-5; Sat: 8:30-12:30 Medicaid - yes;  Tricare - yes  [facility] [place] Bista, MD; Dillard, PA [address], [place], [place] [address] [phone] Mon-Fri: 8-5 Medicaid - yes; Tricare - yes  [facility] [place] Beasley, CPNP; Mahle, CPNP; Rice, MD; Duffy, MD; Culler, MD; [address], [place], [place] [address] [phone] M-F: 8-5, closed 1-2 for lunch Medicaid - yes; Tricare - yes  [facility] Rickman, PA; Mounce, NP; Smith, MD; Jordan, CPNP; Darty, PA; Ball, MD; Wallace, MD [address] [place] [address], [place], [place] [address] [phone] M-Thurs: 8-7; Fri: 8-6; Sat: 9-12; Sun 2-4 Medicaid - yes; Tricare - yes  [facility] Brown, MD; Shin, MD; Goodman, DNP, FNP; Sebesta, DO; [address], [place], [place] [address] [phone] M-F: 8-5 Medicaid - yes; Tricare - yes  [place] Pediatric Providers  [facility] Dough, MD; Welsh, NP [address], [place], [place] [address] [phone] M - Fri: 8am - 5pm, closed for lunch 12-1 Medicaid - Yes; Tricare - yes  [facility] Manandhar, MD; Riley, MD; Sanger, DO; Vinocur, MD;Hall, PA; Walsh, PA; Campbell, NP [address], [place] [address] [phone] M-F 8:00 - 5:00, Sat 8:00 - 11:30 Medicaid - yes; Tricare - yes  [facility] Khan, MD; Redding, MD, Street, MD, Holt, MD, Burgart, MD; Rhyne, NP; Dickinson, PA;  [address] [place] [address], [place], [place] [address] [phone] M-F 8:10am - 5:00pm Medicaid - yes; Tricare - yes  [facility] Connors, MD; Kime, NP [address], [place], [place] [address] [phone] M-F 8:00 - 5:00 Medicaid - [place] Medicaid only; Tricare - yes  [facility] [place] Whyte, MD; Fox, NP [address], [place], [place] [address] [phone] M-F 8:00 - 5:00; Closed for lunch 12 - 1:00 Medicaid - yes; Tricare - yes  [facility] Penner, MD; Wilburn, FNP [address], [place], [place] [address] [phone] Mon 9-5; Tues/Wed 10-5; Thurs 8:30-5; Fri: 8-12:30 Medicaid - yes; Tricare - yes  [place] Pediatric Providers  [facility]  Golding, MD; Jackson, PA-C [address], [place], [place] [address] [phone] phone [phone] fax M-F 7:15 - 4:30 Medicaid - yes; Tricare - yes  [place] [facility] Gosrani, MD; Meccariello, DO [address] , [place] [address] [phone] M-Fri: 8:30 - 5:00, closed for lunch everyday noon - 1pm Medicaid - Yes; Tricare - yes  [facility] Burdine, MD; Daniel, MD; Howard, MD; Sasser, MD; Boles, PA; Boyd, PA-C; Carroll, PA; McGee, PA; Skillman,   PA; Wilson, PA [address] [place], [place] [address] [phone] M-Thurs: 7:30am - 7:00pm; Friday 7:30am - 4pm; Sat: 8:00 - 1:00 Medicaid - Yes; Tricare - yes  [facility] [place] Akhbari, MD; Law, MD; Qayumi, MD; Salvador, DO [address], [place], [place] [address] [phone] M-Thur: 8:00 - 5:00, Fri: 8:00 - Noon Medicaid - yes; Tricare - yes No [place] [employer]  [facility] [place] [facility] Dettinger, MD; Gottschalk, DO; Hawks, NP; Martin, NP; Morgan, NP; Milian, NP; Rakes, NP; Stacks, MD; Webster, PA [address], [place], [place] [address] [phone] M-F 8:00 - 5:00 Medicaid - yes; Tricare - yes  [facility] Collins, FNP-C; Bucio, FNP-C [address], [place], [place] [address] [phone] M, W, R 8:00-5:00, Tues: 8:00am - 7:00pm; Fri 8:00 - noon Medicaid - Yes; Tricare - yes  [facility] Khan, MD [address] [place]  [address] [place], [place] [address] [phone]  M-Thurs 8:30-5:30, Fri: 8:30-12:30pm Medicaid - Yes; Tricare - N  

## 2022-10-21 ENCOUNTER — Ambulatory Visit (INDEPENDENT_AMBULATORY_CARE_PROVIDER_SITE_OTHER): Payer: Self-pay | Admitting: Family Medicine

## 2022-10-21 ENCOUNTER — Encounter: Payer: Self-pay | Admitting: Family Medicine

## 2022-10-21 ENCOUNTER — Other Ambulatory Visit: Payer: Self-pay

## 2022-10-21 ENCOUNTER — Other Ambulatory Visit (HOSPITAL_COMMUNITY)
Admission: RE | Admit: 2022-10-21 | Discharge: 2022-10-21 | Disposition: A | Discharge status: Home / Self Care | Payer: Medicaid Other | Source: Ambulatory Visit | Attending: Family Medicine | Admitting: Family Medicine

## 2022-10-21 VITALS — BP 107/68 | HR 74 | Wt 155.0 lb

## 2022-10-21 DIAGNOSIS — Z3492 Encounter for supervision of normal pregnancy, unspecified, second trimester: Secondary | ICD-10-CM | POA: Diagnosis not present

## 2022-10-21 DIAGNOSIS — Z3A20 20 weeks gestation of pregnancy: Secondary | ICD-10-CM

## 2022-10-21 DIAGNOSIS — N898 Other specified noninflammatory disorders of vagina: Secondary | ICD-10-CM | POA: Insufficient documentation

## 2022-10-21 DIAGNOSIS — O09299 Supervision of pregnancy with other poor reproductive or obstetric history, unspecified trimester: Secondary | ICD-10-CM

## 2022-10-21 DIAGNOSIS — O09292 Supervision of pregnancy with other poor reproductive or obstetric history, second trimester: Secondary | ICD-10-CM

## 2022-10-21 MED ORDER — ASPIRIN 81 MG PO CHEW: 81.0000 mg | CHEWABLE_TABLET | Freq: Every day | ORAL | 2 refills | Status: DC

## 2022-10-21 NOTE — Progress Notes (Signed)
Subjective:   Stacey Woods is a 22 y.o. G2P1001 at [redacted]w[redacted]d by LMP being seen today for her first obstetrical visit.  Her obstetrical history is significant for intrauterine growth restriction (IUGR). Patient does not intend to breast feed. Pregnancy history fully reviewed.  Patient reports  vaginal odor. Had BV and previously treated with flagyl, but could not get her meds.  HISTORY: OB History  Gravida Para Term Preterm AB Living  2 1 1  0 0 1  SAB IAB Ectopic Multiple Live Births  0 0 0 0 1    # Outcome Date GA Lbr Len/2nd Weight Sex Delivery Anes PTL Lv  2 Current           1 Term 03/26/21 [redacted]w[redacted]d 01:54 / 00:06 5 lb 6.2 oz (2.444 kg) F Vag-Spont EPI  LIV     Name: Maradiaga,GIRL Arayna     Apgar1: 9  Apgar5: 9    Past Medical History:  Diagnosis Date   Anemia    Anemia of pregnancy 10/14/2022   9.7 at 28 weeks,Pt should take otc iron once a day. Repeat CBC in 3-4 weeks.   GC (gonococcus)    Gonorrhea 09/11/2020   Pain of toe of right foot 07/29/2019   Pregnancy 09/07/2020   Pregnancy affected by fetal growth restriction 10/14/2022   EFW at 32.6: 20.9%, AC 3%   Turf toe 10/28/2019   Past Surgical History:  Procedure Laterality Date   SESAMOIDECTOMY Right 11/10/2019   Procedure: Right plantar plate repair;  Surgeon: Toni Arthurs, MD;  Location: [facility];  Service: Orthopedics;  Laterality: Right;   Family History  Problem Relation Age of Onset   Hypertension Mother    Healthy Father    Cancer Paternal Grandmother    Social History   Tobacco Use   Smoking status: Former    Types: Cigarettes    Quit date: 08/28/2020    Years since quitting: 2.1   Smokeless tobacco: Never   Tobacco comments:    stopped smoking a week ago   Vaping Use   Vaping Use: Never used  Substance Use Topics   Alcohol use: Never   Drug use: Never   Allergies  Allergen Reactions   No Known Allergies Other (See Comments)   Current Outpatient Medications on File Prior  to Visit  Medication Sig Dispense Refill   Prenatal Vit-Fe Fumarate-FA (PRENATAL COMPLETE) 14-0.4 MG TABS Take 1 tablet by mouth daily. 90 tablet 1   No current facility-administered medications on file prior to visit.     Exam   Vitals:   10/21/22 1417  BP: 107/68  Pulse: 74  Weight: 155 lb (70.3 kg)   Fetal Heart Rate (bpm): 152  Uterus:  Fundal Height: 20 cm  Pelvic Exam: Perineum: no hemorrhoids, normal perineum   Vulva: normal external genitalia, no lesions   Vagina:  normal mucosa, normal discharge   Cervix: no lesions and normal, pap smear done.    Adnexa: normal adnexa and no mass, fullness, tenderness   Bony Pelvis: average  System: General: well-developed, well-nourished female in no acute distress   Skin: normal coloration and turgor, no rashes   Neurologic: oriented, normal, negative, normal mood   Extremities: normal strength, tone, and muscle mass, ROM of all joints is normal   HEENT PERRLA, extraocular movement intact and sclera clear, anicteric   Mouth/Teeth mucous membranes moist, pharynx normal without lesions and dental hygiene good   Neck supple and no masses  Cardiovascular: regular rate and rhythm   Respiratory:  no respiratory distress, normal breath sounds   Abdomen: soft, non-tender; bowel sounds normal; no masses,  no organomegaly     Assessment:   Pregnancy: G2P1001 Patient Active Problem List   Diagnosis Date Noted   Supervision of low-risk pregnancy 10/14/2022   IUGR (intrauterine growth restriction) in prior pregnancy, pregnant 03/26/2021     Plan:  1. Encounter for supervision of low-risk pregnancy in second trimester New OB labs and genetics today - CBC/D/Plt+RPR+Rh+ABO+RubIgG... - HgB A1c - Culture, OB Urine - Cytology - PAP( [place]) - PANORAMA PRENATAL TEST - AFP, Serum, Open Spina Bifida  2. IUGR (intrauterine growth restriction) in prior pregnancy, pregnant Monitor FH Consider serial u/s for growth - aspirin 81 MG  chewable tablet; Chew 1 tablet (81 mg total) by mouth daily.  Dispense: 90 tablet; Refill: 2  3. Vaginal odor Check wet prep and treat accordingly Trial of Boric acid - Cervicovaginal ancillary only( [place])   Initial labs drawn. Continue prenatal vitamins. Genetic Screening discussed, NIPS: ordered. Ultrasound discussed; fetal anatomic survey: ordered. Problem list reviewed and updated.  Routine obstetric precautions reviewed. Return in 4 weeks (on 11/18/2022).

## 2022-10-22 ENCOUNTER — Encounter: Payer: Self-pay | Admitting: *Deleted

## 2022-10-22 LAB — CERVICOVAGINAL ANCILLARY ONLY
Bacterial Vaginitis (gardnerella): POSITIVE — AB
Candida Glabrata: NEGATIVE
Candida Vaginitis: POSITIVE — AB
Comment: NEGATIVE
Comment: NEGATIVE
Comment: NEGATIVE

## 2022-10-22 MED ORDER — TERCONAZOLE 0.4 % VA CREA: 1.0000 | TOPICAL_CREAM | Freq: Every day | VAGINAL | 0 refills | Status: AC

## 2022-10-22 MED ORDER — METRONIDAZOLE 500 MG PO TABS: 500.0000 mg | ORAL_TABLET | Freq: Two times a day (BID) | ORAL | 0 refills | Status: AC

## 2022-10-22 NOTE — Addendum Note (Signed)
Addended by: Reva Bores on: 10/22/2022 05:14 PM   Modules accepted: Orders

## 2022-10-23 ENCOUNTER — Encounter: Payer: Self-pay | Admitting: General Practice

## 2022-10-23 LAB — CBC/D/PLT+RPR+RH+ABO+RUBIGG...
Antibody Screen: NEGATIVE
Basophils Absolute: 0 10*3/uL (ref 0.0–0.2)
Basos: 0 %
EOS (ABSOLUTE): 0 10*3/uL (ref 0.0–0.4)
Eos: 1 %
HCV Ab: NONREACTIVE
HIV Screen 4th Generation wRfx: NONREACTIVE
Hematocrit: 32.7 % — ABNORMAL LOW (ref 34.0–46.6)
Hemoglobin: 9.5 g/dL — ABNORMAL LOW (ref 11.1–15.9)
Hepatitis B Surface Ag: NEGATIVE
Immature Grans (Abs): 0 10*3/uL (ref 0.0–0.1)
Immature Granulocytes: 0 %
Lymphocytes Absolute: 2.2 10*3/uL (ref 0.7–3.1)
Lymphs: 26 %
MCH: 22 pg — ABNORMAL LOW (ref 26.6–33.0)
MCHC: 29.1 g/dL — ABNORMAL LOW (ref 31.5–35.7)
MCV: 76 fL — ABNORMAL LOW (ref 79–97)
Monocytes Absolute: 0.5 10*3/uL (ref 0.1–0.9)
Monocytes: 6 %
Neutrophils Absolute: 5.6 10*3/uL (ref 1.4–7.0)
Neutrophils: 67 %
Platelets: 270 10*3/uL (ref 150–450)
RBC: 4.31 x10E6/uL (ref 3.77–5.28)
RDW: 17 % — ABNORMAL HIGH (ref 11.7–15.4)
RPR Ser Ql: NONREACTIVE
Rh Factor: POSITIVE
Rubella Antibodies, IGG: 8.54 index (ref >0.99)
WBC: 8.4 10*3/uL (ref 3.4–10.8)

## 2022-10-23 LAB — AFP, SERUM, OPEN SPINA BIFIDA
AFP MoM: 1.03
AFP Value: 70.2 ng/mL
Gest. Age on Collection Date: 20.6 weeks
Maternal Age At EDD: 23.6 yr
OSBR Risk 1 IN: 10000
Test Results:: NEGATIVE
Weight: 155 [lb_av]

## 2022-10-23 LAB — HCV INTERPRETATION

## 2022-10-23 LAB — HEMOGLOBIN A1C
Est. average glucose Bld gHb Est-mCnc: 105 mg/dL
Hgb A1c MFr Bld: 5.3 % (ref 4.8–5.6)

## 2022-10-27 LAB — URINE CULTURE, OB REFLEX

## 2022-10-27 LAB — CULTURE, OB URINE

## 2022-10-29 LAB — PANORAMA PRENATAL TEST FULL PANEL:PANORAMA TEST PLUS 5 ADDITIONAL MICRODELETIONS: FETAL FRACTION: 8.5

## 2022-10-30 LAB — CYTOLOGY - PAP

## 2022-11-11 ENCOUNTER — Ambulatory Visit: Payer: Medicaid Other | Admitting: *Deleted

## 2022-11-11 ENCOUNTER — Other Ambulatory Visit: Payer: Self-pay | Admitting: *Deleted

## 2022-11-11 ENCOUNTER — Ambulatory Visit: Payer: Medicaid Other | Attending: Family Medicine

## 2022-11-11 VITALS — BP 119/65 | HR 78

## 2022-11-11 DIAGNOSIS — O09892 Supervision of other high risk pregnancies, second trimester: Secondary | ICD-10-CM | POA: Insufficient documentation

## 2022-11-11 DIAGNOSIS — O99012 Anemia complicating pregnancy, second trimester: Secondary | ICD-10-CM | POA: Diagnosis not present

## 2022-11-11 DIAGNOSIS — O09292 Supervision of pregnancy with other poor reproductive or obstetric history, second trimester: Secondary | ICD-10-CM | POA: Insufficient documentation

## 2022-11-11 DIAGNOSIS — Z3492 Encounter for supervision of normal pregnancy, unspecified, second trimester: Secondary | ICD-10-CM

## 2022-11-11 DIAGNOSIS — Z3A23 23 weeks gestation of pregnancy: Secondary | ICD-10-CM | POA: Insufficient documentation

## 2022-11-11 DIAGNOSIS — Z3689 Encounter for other specified antenatal screening: Secondary | ICD-10-CM

## 2022-11-11 DIAGNOSIS — Z363 Encounter for antenatal screening for malformations: Secondary | ICD-10-CM | POA: Insufficient documentation

## 2022-11-18 ENCOUNTER — Other Ambulatory Visit: Payer: Self-pay

## 2022-11-18 ENCOUNTER — Ambulatory Visit (INDEPENDENT_AMBULATORY_CARE_PROVIDER_SITE_OTHER): Payer: Medicaid Other | Admitting: Advanced Practice Midwife

## 2022-11-18 VITALS — BP 115/73 | HR 84 | Wt 162.7 lb

## 2022-11-18 DIAGNOSIS — Z3492 Encounter for supervision of normal pregnancy, unspecified, second trimester: Secondary | ICD-10-CM

## 2022-11-18 DIAGNOSIS — Z3A24 24 weeks gestation of pregnancy: Secondary | ICD-10-CM

## 2022-11-18 NOTE — Progress Notes (Signed)
   PRENATAL VISIT NOTE  Subjective:  Stacey Woods is a 22 y.o. G2P1001 at [redacted]w[redacted]d being seen today for ongoing prenatal care.  She is currently monitored for the following issues for this low-risk pregnancy and has IUGR (intrauterine growth restriction) in prior pregnancy, pregnant and Supervision of low-risk pregnancy on their problem list.  Patient reports no complaints.  Contractions: Not present. Vag. Bleeding: None.  Movement: Present. Denies leaking of fluid.   The following portions of the patient's history were reviewed and updated as appropriate: allergies, current medications, past family history, past medical history, past social history, past surgical history and problem list.   Objective:   Vitals:   11/18/22 1117  BP: 115/73  Pulse: 84  Weight: 162 lb 11.2 oz (73.8 kg)    Fetal Status: Fetal Heart Rate (bpm): 145 Fundal Height: 23 cm Movement: Present     General:  Alert, oriented and cooperative. Patient is in no acute distress.  Skin: Skin is warm and dry. No rash noted.   Cardiovascular: Normal heart rate noted  Respiratory: Normal respiratory effort, no problems with respiration noted  Abdomen: Soft, gravid, appropriate for gestational age.  Pain/Pressure: Absent     Pelvic: Cervical exam deferred        Extremities: Normal range of motion.  Edema: None  Mental Status: Normal mood and affect. Normal behavior. Normal judgment and thought content.   Assessment and Plan:  Pregnancy: G2P1001 at [redacted]w[redacted]d 1. Encounter for supervision of low-risk pregnancy in second trimester - Routine care - Offered, declined Centering  Preterm labor symptoms and general obstetric precautions including but not limited to vaginal bleeding, contractions, leaking of fluid and fetal movement were reviewed in detail with the patient. Please refer to After Visit Summary for other counseling recommendations.   Return in about 4 weeks (around 12/16/2022) for ROB/GTT.  Future Appointments   Date Time Provider Department Center  12/09/2022  3:15 PM [facility] NURSE [facility]  12/09/2022  3:30 PM [facility] US2 [facility]  12/16/2022  9:30 AM [facility] LAB [facility]  12/16/2022 10:15 AM Carlynn Herald, CNM [facility]  12/23/2022  1:15 PM Lorriane Shire, MD [facility]    Dorathy Kinsman, CNM

## 2022-11-18 NOTE — Patient Instructions (Addendum)
TDaP Vaccine Pregnancy Get the Whooping Cough Vaccine While You Are Pregnant (CDC)  It is important for women to get the whooping cough vaccine in the third trimester of each pregnancy. Vaccines are the best way to prevent this disease. There are 2 different whooping cough vaccines. Both vaccines combine protection against whooping cough, tetanus and diphtheria, but they are for different age groups: Tdap: for everyone 11 years or older, including pregnant women  DTaP: for children 2 months through 6 years of age  You need the whooping cough vaccine during each of your pregnancies The recommended time to get the shot is during your 27th through 36th week of pregnancy, preferably during the earlier part of this time period. The Centers for Disease Control and Prevention (CDC) recommends that pregnant women receive the whooping cough vaccine for adolescents and adults (called Tdap vaccine) during the third trimester of each pregnancy. The recommended time to get the shot is during your 27th through 36th week of pregnancy, preferably during the earlier part of this time period. This replaces the original recommendation that pregnant women get the vaccine only if they had not previously received it. The American College of Obstetricians and Gynecologists and the American College of Nurse-Midwives support this recommendation.  You should get the whooping cough vaccine while pregnant to pass protection to your baby frame support disabled and/or not supported in this browser  Learn why Stacey Woods decided to get the whooping cough vaccine in her 3rd trimester of pregnancy and how her baby girl was born with some protection against the disease. Also available on YouTube. After receiving the whooping cough vaccine, your body will create protective antibodies (proteins produced by the body to fight off diseases) and pass some of them to your baby before birth. These antibodies provide your baby some short-term  protection against whooping cough in early life. These antibodies can also protect your baby from some of the more serious complications that come along with whooping cough. Your protective antibodies are at their highest about 2 weeks after getting the vaccine, but it takes time to pass them to your baby. So the preferred time to get the whooping cough vaccine is early in your third trimester. The amount of whooping cough antibodies in your body decreases over time. That is why CDC recommends you get a whooping cough vaccine during each pregnancy. Doing so allows each of your babies to get the greatest number of protective antibodies from you. This means each of your babies will get the best protection possible against this disease.  Getting the whooping cough vaccine while pregnant is better than getting the vaccine after you give birth Whooping cough vaccination during pregnancy is ideal so your baby will have short-term protection as soon as he is born. This early protection is important because your baby will not start getting his whooping cough vaccines until he is 2 months old. These first few months of life are when your baby is at greatest risk for catching whooping cough. This is also when he's at greatest risk for having severe, potentially life-threating complications from the infection. To avoid that gap in protection, it is best to get a whooping cough vaccine during pregnancy. You will then pass protection to your baby before he is born. To continue protecting your baby, he should get whooping cough vaccines starting at 2 months old. You may never have gotten the Tdap vaccine before and did not get it during this pregnancy. If so, you should make sure   to get the vaccine immediately after you give birth, before leaving the hospital or birthing center. It will take about 2 weeks before your body develops protection (antibodies) in response to the vaccine. Once you have protection from the vaccine,  you are less likely to give whooping cough to your newborn while caring for him. But remember, your baby will still be at risk for catching whooping cough from others. A recent study looked to see how effective Tdap was at preventing whooping cough in babies whose mothers got the vaccine while pregnant or in the hospital after giving birth. The study found that getting Tdap between 27 through 36 weeks of pregnancy is 85% more effective at preventing whooping cough in babies younger than 2 months old. Blood tests cannot tell if you need a whooping cough vaccine There are no blood tests that can tell you if you have enough antibodies in your body to protect yourself or your baby against whooping cough. Even if you have been sick with whooping cough in the past or previously received the vaccine, you still should get the vaccine during each pregnancy. Breastfeeding may pass some protective antibodies onto your baby By breastfeeding, you may pass some antibodies you have made in response to the vaccine to your baby. When you get a whooping cough vaccine during your pregnancy, you will have antibodies in your breast milk that you can share with your baby as soon as your milk comes in. However, your baby will not get protective antibodies immediately if you wait to get the whooping cough vaccine until after delivering your baby. This is because it takes about 2 weeks for your body to create antibodies. Learn more about the health benefits of breastfeeding.  

## 2022-12-09 ENCOUNTER — Other Ambulatory Visit: Payer: Self-pay | Admitting: Obstetrics and Gynecology

## 2022-12-09 ENCOUNTER — Encounter: Payer: Self-pay | Admitting: *Deleted

## 2022-12-09 ENCOUNTER — Other Ambulatory Visit: Payer: Self-pay | Admitting: *Deleted

## 2022-12-09 ENCOUNTER — Ambulatory Visit: Payer: Medicaid Other | Admitting: *Deleted

## 2022-12-09 ENCOUNTER — Ambulatory Visit: Payer: Medicaid Other | Attending: Obstetrics and Gynecology

## 2022-12-09 VITALS — BP 130/70 | HR 96

## 2022-12-09 DIAGNOSIS — Z3A27 27 weeks gestation of pregnancy: Secondary | ICD-10-CM | POA: Insufficient documentation

## 2022-12-09 DIAGNOSIS — Z3689 Encounter for other specified antenatal screening: Secondary | ICD-10-CM

## 2022-12-09 DIAGNOSIS — O09292 Supervision of pregnancy with other poor reproductive or obstetric history, second trimester: Secondary | ICD-10-CM | POA: Diagnosis present

## 2022-12-09 DIAGNOSIS — O365930 Maternal care for other known or suspected poor fetal growth, third trimester, not applicable or unspecified: Secondary | ICD-10-CM

## 2022-12-09 DIAGNOSIS — O365920 Maternal care for other known or suspected poor fetal growth, second trimester, not applicable or unspecified: Secondary | ICD-10-CM

## 2022-12-09 DIAGNOSIS — Z3492 Encounter for supervision of normal pregnancy, unspecified, second trimester: Secondary | ICD-10-CM

## 2022-12-09 DIAGNOSIS — O99012 Anemia complicating pregnancy, second trimester: Secondary | ICD-10-CM | POA: Insufficient documentation

## 2022-12-09 DIAGNOSIS — D649 Anemia, unspecified: Secondary | ICD-10-CM | POA: Diagnosis not present

## 2022-12-11 ENCOUNTER — Other Ambulatory Visit: Payer: Self-pay

## 2022-12-11 DIAGNOSIS — Z3492 Encounter for supervision of normal pregnancy, unspecified, second trimester: Secondary | ICD-10-CM

## 2022-12-16 ENCOUNTER — Other Ambulatory Visit: Payer: Medicaid Other

## 2022-12-16 ENCOUNTER — Ambulatory Visit (INDEPENDENT_AMBULATORY_CARE_PROVIDER_SITE_OTHER): Payer: Medicaid Other | Admitting: Certified Nurse Midwife

## 2022-12-16 ENCOUNTER — Other Ambulatory Visit: Payer: Self-pay

## 2022-12-16 VITALS — BP 110/70 | HR 94 | Wt 167.3 lb

## 2022-12-16 DIAGNOSIS — Z3A29 29 weeks gestation of pregnancy: Secondary | ICD-10-CM

## 2022-12-16 DIAGNOSIS — O09293 Supervision of pregnancy with other poor reproductive or obstetric history, third trimester: Secondary | ICD-10-CM

## 2022-12-16 DIAGNOSIS — O09299 Supervision of pregnancy with other poor reproductive or obstetric history, unspecified trimester: Secondary | ICD-10-CM

## 2022-12-16 DIAGNOSIS — Z3A28 28 weeks gestation of pregnancy: Secondary | ICD-10-CM

## 2022-12-16 DIAGNOSIS — Z3492 Encounter for supervision of normal pregnancy, unspecified, second trimester: Secondary | ICD-10-CM

## 2022-12-17 LAB — CBC
Hematocrit: 28.7 % — ABNORMAL LOW (ref 34.0–46.6)
Hemoglobin: 8.4 g/dL — ABNORMAL LOW (ref 11.1–15.9)
MCH: 20.8 pg — ABNORMAL LOW (ref 26.6–33.0)
MCHC: 29.3 g/dL — ABNORMAL LOW (ref 31.5–35.7)
MCV: 71 fL — ABNORMAL LOW (ref 79–97)
Platelets: 239 10*3/uL (ref 150–450)
RBC: 4.03 x10E6/uL (ref 3.77–5.28)
RDW: 16.7 % — ABNORMAL HIGH (ref 11.7–15.4)
WBC: 9.2 10*3/uL (ref 3.4–10.8)

## 2022-12-17 LAB — GLUCOSE TOLERANCE, 2 HOURS W/ 1HR
Glucose, 1 hour: 101 mg/dL (ref 70–179)
Glucose, 2 hour: 88 mg/dL (ref 70–152)
Glucose, Fasting: 75 mg/dL (ref 70–91)

## 2022-12-17 LAB — HIV ANTIBODY (ROUTINE TESTING W REFLEX): HIV Screen 4th Generation wRfx: NONREACTIVE

## 2022-12-17 LAB — RPR: RPR Ser Ql: NONREACTIVE

## 2022-12-17 NOTE — Progress Notes (Signed)
   PRENATAL VISIT NOTE  Subjective:  Stacey Woods is a 22 y.o. G2P1001 at [redacted]w[redacted]d being seen today for ongoing prenatal care.  She is currently monitored for the following issues for this high-risk pregnancy and has IUGR (intrauterine growth restriction) in prior pregnancy, pregnant and Supervision of low-risk pregnancy on their problem list.  Patient reports no complaints.  Contractions: Not present. Vag. Bleeding: None.  Movement: Present. Denies leaking of fluid.   The following portions of the patient's history were reviewed and updated as appropriate: allergies, current medications, past family history, past medical history, past social history, past surgical history and problem list.   Objective:   Vitals:   12/16/22 1005  BP: 110/70  Pulse: 94  Weight: 167 lb 4.8 oz (75.9 kg)    Fetal Status: Fetal Heart Rate (bpm): 143 Fundal Height: 28 cm Movement: Present     General:  Alert, oriented and cooperative. Patient is in no acute distress.  Skin: Skin is warm and dry. No rash noted.   Cardiovascular: Normal heart rate noted  Respiratory: Normal respiratory effort, no problems with respiration noted  Abdomen: Soft, gravid, appropriate for gestational age.  Pain/Pressure: Absent     Pelvic: Cervical exam deferred        Extremities: Normal range of motion.  Edema: None  Mental Status: Normal mood and affect. Normal behavior. Normal judgment and thought content.   Assessment and Plan:  Pregnancy: G2P1001 at [redacted]w[redacted]d 1. Encounter for supervision of low-risk pregnancy in second trimester - Patient reports vigorous and frequent fetal movement,   2. [redacted] weeks gestation of pregnancy - GTT today  - Patient declined Tdap Vaccine   3. IUGR (intrauterine growth restriction) in prior pregnancy, pregnant - Fundal height 28 cm today - Appropriate for gestational age   Preterm labor symptoms and general obstetric precautions including but not limited to vaginal bleeding, contractions,  leaking of fluid and fetal movement were reviewed in detail with the patient. Please refer to After Visit Summary for other counseling recommendations.   Return in about 2 weeks (around 12/30/2022) for LOB.  Future Appointments  Date Time Provider Department Center  12/23/2022  7:30 AM [facility] US5 [facility]  12/23/2022  1:15 PM Lorriane Shire, MD [facility]  12/29/2022  2:30 PM [facility] US6 [facility]  12/30/2022 10:15 AM Carlynn Herald, CNM [facility]  01/05/2023 12:30 PM [facility] US3 [facility]    Skya Mccullum Danella Deis) Suzie Portela, MSN, CNM  Center for [facility]  12/17/2022 1:17 PM

## 2022-12-22 ENCOUNTER — Encounter (HOSPITAL_COMMUNITY): Payer: Self-pay | Admitting: Obstetrics and Gynecology

## 2022-12-22 ENCOUNTER — Inpatient Hospital Stay (HOSPITAL_COMMUNITY)
Admission: AD | Admit: 2022-12-22 | Discharge: 2022-12-22 | Disposition: A | Discharge status: Home / Self Care | Payer: Medicaid Other | Attending: Obstetrics and Gynecology | Admitting: Obstetrics and Gynecology

## 2022-12-22 DIAGNOSIS — B9689 Other specified bacterial agents as the cause of diseases classified elsewhere: Secondary | ICD-10-CM | POA: Diagnosis not present

## 2022-12-22 DIAGNOSIS — O23593 Infection of other part of genital tract in pregnancy, third trimester: Secondary | ICD-10-CM | POA: Diagnosis not present

## 2022-12-22 DIAGNOSIS — O26893 Other specified pregnancy related conditions, third trimester: Secondary | ICD-10-CM | POA: Insufficient documentation

## 2022-12-22 DIAGNOSIS — Z3A29 29 weeks gestation of pregnancy: Secondary | ICD-10-CM | POA: Diagnosis not present

## 2022-12-22 DIAGNOSIS — R109 Unspecified abdominal pain: Secondary | ICD-10-CM | POA: Diagnosis present

## 2022-12-22 DIAGNOSIS — O26899 Other specified pregnancy related conditions, unspecified trimester: Secondary | ICD-10-CM

## 2022-12-22 LAB — URINALYSIS, ROUTINE W REFLEX MICROSCOPIC
Bilirubin Urine: NEGATIVE
Glucose, UA: 50 mg/dL — AB
Hgb urine dipstick: NEGATIVE
Ketones, ur: NEGATIVE mg/dL
Nitrite: NEGATIVE
Protein, ur: NEGATIVE mg/dL
Specific Gravity, Urine: 1.02 (ref 1.005–1.030)
pH: 6 (ref 5.0–8.0)

## 2022-12-22 LAB — WET PREP, GENITAL
Sperm: NONE SEEN
Trich, Wet Prep: NONE SEEN
WBC, Wet Prep HPF POC: >=10 — AB (ref <10)
Yeast Wet Prep HPF POC: NONE SEEN

## 2022-12-22 MED ORDER — METRONIDAZOLE 0.75 % VA GEL: 1.0000 | Freq: Every day | VAGINAL | 0 refills | Status: DC

## 2022-12-22 NOTE — MAU Provider Note (Signed)
History     CSN: [id number]  Arrival date and time: 12/22/22 1456   Event Date/Time   First Provider Initiated Contact with Patient 12/22/22 1648      Chief Complaint  Patient presents with   Abdominal Pain   HPI Ms. Stacey Woods is a 21 y.o. year old G23P1001 female at [redacted]w[redacted]d weeks gestation who presents to MAU reporting lower abdominal pain since yesterday. She describes the pain as tightness; R>L. She states the pain started wrapping around her sides while she was working at [employer] today that she "had to keep sitting down." She reports the pain is worse with standing and better when she is sitting. She denies any VB or LOF. She reports (+) FM. She receives [facility] with [facility]; next appt is 12/30/2022.   OB History     Gravida  2   Para  1   Term  1   Preterm      AB      Living  1      SAB      IAB      Ectopic      Multiple  0   Live Births  1           Past Medical History:  Diagnosis Date   Anemia    Anemia of pregnancy 10/14/2022   9.7 at 28 weeks,Pt should take otc iron once a day. Repeat CBC in 3-4 weeks.   GC (gonococcus)    Gonorrhea 09/11/2020   Pain of toe of right foot 07/29/2019   Pregnancy 09/07/2020   Pregnancy affected by fetal growth restriction 10/14/2022   EFW at 32.6: 20.9%, AC 3%   Turf toe 10/28/2019    Past Surgical History:  Procedure Laterality Date   SESAMOIDECTOMY Right 11/10/2019   Procedure: Right plantar plate repair;  Surgeon: Toni Arthurs, MD;  Location: [facility];  Service: Orthopedics;  Laterality: Right;    Family History  Problem Relation Age of Onset   Hypertension Mother    Healthy Father    Cancer Paternal Grandmother     Social History   Tobacco Use   Smoking status: Former    Current packs/day: 0.00    Types: Cigarettes    Quit date: 08/28/2020    Years since quitting: 2.3   Smokeless tobacco: Never   Tobacco comments:    stopped smoking a week ago   Vaping Use   Vaping status:  Never Used  Substance Use Topics   Alcohol use: Never   Drug use: Never    Allergies:  Allergies  Allergen Reactions   No Known Allergies Other (See Comments)    Medications Prior to Admission  Medication Sig Dispense Refill Last Dose   aspirin 81 MG chewable tablet Chew 1 tablet (81 mg total) by mouth daily. 90 tablet 2 12/22/2022   Prenatal Vit-Fe Fumarate-FA (PRENATAL COMPLETE) 14-0.4 MG TABS Take 1 tablet by mouth daily. 90 tablet 1 12/22/2022    Review of Systems  Constitutional: Negative.   HENT: Negative.    Eyes: Negative.   Respiratory: Negative.    Cardiovascular: Negative.   Gastrointestinal: Negative.   Endocrine: Negative.   Genitourinary:  Positive for pelvic pain (R>L).  Musculoskeletal: Negative.   Skin: Negative.   Allergic/Immunologic: Negative.   Neurological: Negative.   Hematological: Negative.   Psychiatric/Behavioral: Negative.     Physical Exam   Blood pressure 132/73, pulse 70, temperature 98.2 F (36.8 C), temperature source Oral, resp. rate  16, height 5\' 4"  (1.626 m), weight 78.3 kg, last menstrual period 05/28/2022, SpO2 100%, unknown if currently breastfeeding.  Physical Exam Vitals and nursing note reviewed. Exam conducted with a chaperone present.  Constitutional:      Appearance: Normal appearance. She is normal weight.  Cardiovascular:     Rate and Rhythm: Normal rate.  Pulmonary:     Effort: Pulmonary effort is normal.  Abdominal:     Palpations: Abdomen is soft.  Genitourinary:    General: Normal vulva.     Comments: Swabs collected by RN using blind swab technique  Musculoskeletal:        General: Normal range of motion.  Skin:    General: Skin is warm and dry.  Neurological:     Mental Status: She is alert and oriented to person, place, and time.  Psychiatric:        Mood and Affect: Mood normal.        Behavior: Behavior normal.        Thought Content: Thought content normal.        Judgment: Judgment normal.     Dilation: Closed Effacement (%): Thick Cervical Position: Posterior Station: Ballotable Presentation: Undeterminable Exam by:: RArita Miss, CNM   REACTIVE NST - FHR: 140 bpm / moderate variability / accels present / decels absent / TOCO: UI Noted  MAU Course  Procedures  MDM Wet Prep GC/CT -- Results pending   Assessment and Plan  1. Bacterial vaginosis in pregnancy - Information provided on BV - Rx: Metrogel 0.4% vaginally hs x 5 days   2. Abdominal cramping affecting pregnancy - Information provided on abdominal pain in pregnancy   3. [redacted] weeks gestation of pregnancy   - Discharge patient - Keep scheduled appt with [facility] on 12/30/2022 - Patient verbalized an understanding of the plan of care and agrees.   Raelyn Mora, CNM 12/22/2022, 4:48 PM

## 2022-12-22 NOTE — MAU Note (Signed)
.  Stacey Woods is a 22 y.o. at [redacted]w[redacted]d here in MAU reporting: she was at work today when she began noticing lower abdominal cramping/tightness that wraps around to her sides. She said the pain is worse when standing and better when sitting.   Denies VB or LOF. Reports +FM.   Onset of complaint: Today Pain score: 6/10 Vitals:   12/22/22 1518  BP: 132/77  Pulse: 70  Resp: 16  Temp: 98.2 F (36.8 C)  SpO2: 100%     LKG:MWNUUV triage/pt taken straight to room Lab orders placed from triage:  UA

## 2022-12-23 ENCOUNTER — Ambulatory Visit: Payer: Medicaid Other | Admitting: Obstetrics and Gynecology

## 2022-12-23 ENCOUNTER — Encounter: Payer: Self-pay | Admitting: Advanced Practice Midwife

## 2022-12-23 ENCOUNTER — Ambulatory Visit: Payer: Medicaid Other | Attending: Obstetrics and Gynecology

## 2022-12-23 DIAGNOSIS — O09293 Supervision of pregnancy with other poor reproductive or obstetric history, third trimester: Secondary | ICD-10-CM

## 2022-12-23 DIAGNOSIS — O365930 Maternal care for other known or suspected poor fetal growth, third trimester, not applicable or unspecified: Secondary | ICD-10-CM | POA: Diagnosis present

## 2022-12-23 DIAGNOSIS — D649 Anemia, unspecified: Secondary | ICD-10-CM | POA: Diagnosis not present

## 2022-12-23 DIAGNOSIS — O99013 Anemia complicating pregnancy, third trimester: Secondary | ICD-10-CM

## 2022-12-23 DIAGNOSIS — Z3A29 29 weeks gestation of pregnancy: Secondary | ICD-10-CM

## 2022-12-23 LAB — GC/CHLAMYDIA PROBE AMP (~~LOC~~) NOT AT ARMC
Chlamydia: NEGATIVE
Comment: NEGATIVE
Comment: NORMAL
Neisseria Gonorrhea: NEGATIVE

## 2022-12-29 ENCOUNTER — Ambulatory Visit: Payer: Medicaid Other | Attending: Obstetrics and Gynecology

## 2022-12-29 DIAGNOSIS — O09293 Supervision of pregnancy with other poor reproductive or obstetric history, third trimester: Secondary | ICD-10-CM

## 2022-12-29 DIAGNOSIS — O365930 Maternal care for other known or suspected poor fetal growth, third trimester, not applicable or unspecified: Secondary | ICD-10-CM | POA: Diagnosis present

## 2022-12-29 DIAGNOSIS — Z3A30 30 weeks gestation of pregnancy: Secondary | ICD-10-CM | POA: Diagnosis not present

## 2022-12-30 ENCOUNTER — Ambulatory Visit (INDEPENDENT_AMBULATORY_CARE_PROVIDER_SITE_OTHER): Payer: Medicaid Other | Admitting: Certified Nurse Midwife

## 2022-12-30 ENCOUNTER — Other Ambulatory Visit: Payer: Self-pay

## 2022-12-30 ENCOUNTER — Telehealth: Payer: Self-pay

## 2022-12-30 ENCOUNTER — Other Ambulatory Visit: Payer: Self-pay | Admitting: *Deleted

## 2022-12-30 VITALS — BP 127/74 | HR 89 | Wt 170.4 lb

## 2022-12-30 DIAGNOSIS — O99013 Anemia complicating pregnancy, third trimester: Secondary | ICD-10-CM

## 2022-12-30 DIAGNOSIS — Z3492 Encounter for supervision of normal pregnancy, unspecified, second trimester: Secondary | ICD-10-CM

## 2022-12-30 DIAGNOSIS — O368330 Maternal care for abnormalities of the fetal heart rate or rhythm, third trimester, not applicable or unspecified: Secondary | ICD-10-CM

## 2022-12-30 DIAGNOSIS — O365930 Maternal care for other known or suspected poor fetal growth, third trimester, not applicable or unspecified: Secondary | ICD-10-CM

## 2022-12-30 DIAGNOSIS — O09299 Supervision of pregnancy with other poor reproductive or obstetric history, unspecified trimester: Secondary | ICD-10-CM

## 2022-12-30 DIAGNOSIS — Z3A30 30 weeks gestation of pregnancy: Secondary | ICD-10-CM

## 2022-12-30 DIAGNOSIS — O23593 Infection of other part of genital tract in pregnancy, third trimester: Secondary | ICD-10-CM

## 2022-12-30 DIAGNOSIS — O09293 Supervision of pregnancy with other poor reproductive or obstetric history, third trimester: Secondary | ICD-10-CM

## 2022-12-30 DIAGNOSIS — B9689 Other specified bacterial agents as the cause of diseases classified elsewhere: Secondary | ICD-10-CM

## 2022-12-30 DIAGNOSIS — O368390 Maternal care for abnormalities of the fetal heart rate or rhythm, unspecified trimester, not applicable or unspecified: Secondary | ICD-10-CM

## 2022-12-30 MED ORDER — METRONIDAZOLE 500 MG PO TABS
500.0000 mg | ORAL_TABLET | Freq: Two times a day (BID) | ORAL | 0 refills | Status: DC
Start: 2022-12-30 — End: 2023-01-27

## 2022-12-30 NOTE — Progress Notes (Deleted)
   PRENATAL VISIT NOTE  Subjective:  Stacey Woods is a 22 y.o. G2P1001 at [redacted]w[redacted]d being seen today for ongoing prenatal care.  She is currently monitored for the following issues for this low-risk pregnancy and has Fetal growth restriction antepartum; Supervision of low-risk pregnancy; and Anemia of pregnancy on their problem list.  Patient reports no bleeding, no contractions, no cramping, and no leaking.  Contractions: Not present. Vag. Bleeding: None.  Movement: Present. Denies leaking of fluid.   The following portions of the patient's history were reviewed and updated as appropriate: allergies, current medications, past family history, past medical history, past social history, past surgical history and problem list.   Objective:   Vitals:   12/30/22 1040  BP: 127/74  Pulse: 89  Weight: 77.3 kg    Fetal Status: Fetal Heart Rate (bpm): 135   Movement: Present     General:  Alert, oriented and cooperative. Patient is in no acute distress.  Skin: Skin is warm and dry. No rash noted.   Cardiovascular: Normal heart rate noted  Respiratory: Normal respiratory effort, no problems with respiration noted  Abdomen: Soft, gravid, appropriate for gestational age.  Pain/Pressure: Absent     Pelvic: Cervical exam deferred        Extremities: Normal range of motion.  Edema: None  Mental Status: Normal mood and affect. Normal behavior. Normal judgment and thought content.   Assessment and Plan:  Pregnancy: G2P1001 at [redacted]w[redacted]d 1. Encounter for supervision of low-risk pregnancy in second trimester ***  2. [redacted] weeks gestation of pregnancy ***  3. Bacterial vaginosis in pregnancy *** - metroNIDAZOLE (FLAGYL) 500 MG tablet; Take 1 tablet (500 mg total) by mouth 2 (two) times daily.  Dispense: 14 tablet; Refill: 0  4. IUGR (intrauterine growth restriction) in prior pregnancy, pregnant ***  5. Fetal arrhythmia affecting pregnancy, antepartum ***  Preterm labor symptoms and general  obstetric precautions including but not limited to vaginal bleeding, contractions, leaking of fluid and fetal movement were reviewed in detail with the patient. Please refer to After Visit Summary for other counseling recommendations.   No follow-ups on file.  Future Appointments  Date Time Provider Department Center  01/05/2023 12:30 PM [facility] US3 [facility]  01/12/2023 10:30 AM [facility]  01/19/2023  1:30 PM [facility] US[facility]  01/26/2023  1:30 PM [facility] US2 [facility]  02/02/2023  1:30 PM [facility] US4 [facility]    Rickelle Sylvestre Mikki [place], Student-MidWife

## 2022-12-30 NOTE — Telephone Encounter (Signed)
Auth Submission: NO AUTH NEEDED Site of care: Site of care: [facility] Payer: [place] Medicaid [employer] Medication & CPT/J Code(s) submitted: Venofer (Iron Sucrose) J1756 Route of submission (phone, fax, portal):  Phone # Fax # Auth type: Buy/Bill PB Units/visits requested: 200mg  x 5 doses Reference number:  Approval from: 12/30/22 to 04/14/23

## 2022-12-30 NOTE — Progress Notes (Signed)
   PRENATAL VISIT NOTE  Subjective:  Stacey Woods is a 22 y.o. G2P1001 at [redacted]w[redacted]d being seen today for ongoing prenatal care.  She is currently monitored for the following issues for this low-risk pregnancy and has Fetal growth restriction antepartum; Supervision of low-risk pregnancy; and Anemia of pregnancy on their problem list.  Patient reports no bleeding, no contractions, no cramping, no leaking, and vaginal irritation.  Contractions: Not present. Vag. Bleeding: None.  Movement: Present. Denies leaking of fluid.   The following portions of the patient's history were reviewed and updated as appropriate: allergies, current medications, past family history, past medical history, past social history, past surgical history and problem list.   Objective:   Vitals:   12/30/22 1040  BP: 127/74  Pulse: 89  Weight: 77.3 kg    Fetal Status: Fetal Heart Rate (bpm): 135 Fundal Height: 29 cm Movement: Present     General:  Alert, oriented and cooperative. Patient is in no acute distress.  Skin: Skin is warm and dry. No rash noted.   Cardiovascular: Normal heart rate noted  Respiratory: Normal respiratory effort, no problems with respiration noted  Abdomen: Soft, gravid, appropriate for gestational age.  Pain/Pressure: Absent     Pelvic: Cervical exam deferred        Extremities: Normal range of motion.  Edema: None  Mental Status: Normal mood and affect. Normal behavior. Normal judgment and thought content.   Assessment and Plan:  Pregnancy: G2P1001 at [redacted]w[redacted]d 1. Encounter for supervision of low-risk pregnancy in second trimester Doing well. Feeling regular and vigorous fetal movement   2. [redacted] weeks gestation of pregnancy Routine OB care   3. Bacterial vaginosis in pregnancy - Ordered metroNIDAZOLE (FLAGYL) 500 MG tablet; Take 1 tablet (500 mg total) by mouth 2 (two) times daily.  Dispense: 14 tablet; Refill: 0  4. IUGR (intrauterine growth restriction) in prior pregnancy,  pregnant Fetal height measured 29cm today appropriate for gestational age. Will continue to monitor growth.   5. Fetal arrhythmia affecting pregnancy, antepartum NST completed in office today FHR 135 moderate variability with accels no decels. Arrythmia noted. Follow up for echo post delivery   6. Anemia during pregnancy third trimester  Iron infusions ordered today   Preterm labor symptoms and general obstetric precautions including but not limited to vaginal bleeding, contractions, leaking of fluid and fetal movement were reviewed in detail with the patient. Please refer to After Visit Summary for other counseling recommendations.   Return in about 2 weeks (around 01/13/2023) for LOB.  Future Appointments  Date Time Provider Department Center  01/05/2023 12:30 PM [facility] US[facility]  01/12/2023 10:30 AM [facility]  01/15/2023 10:15 AM Hermina Staggers, MD [facility]  01/19/2023  1:30 PM [facility] US2 [facility]  01/26/2023  1:30 PM [facility] US2 [facility]  02/02/2023  1:30 PM [facility] US4 [facility]    Reghan Thul [place], Student-MidWife

## 2023-01-05 ENCOUNTER — Ambulatory Visit: Payer: Medicaid Other | Attending: Obstetrics and Gynecology

## 2023-01-05 ENCOUNTER — Ambulatory Visit: Payer: Medicaid Other

## 2023-01-05 DIAGNOSIS — O09293 Supervision of pregnancy with other poor reproductive or obstetric history, third trimester: Secondary | ICD-10-CM

## 2023-01-05 DIAGNOSIS — O365930 Maternal care for other known or suspected poor fetal growth, third trimester, not applicable or unspecified: Secondary | ICD-10-CM | POA: Diagnosis present

## 2023-01-05 DIAGNOSIS — Z3A31 31 weeks gestation of pregnancy: Secondary | ICD-10-CM

## 2023-01-05 MED ORDER — SODIUM CHLORIDE 0.9 % IV SOLN
200.0000 mg | Freq: Once | INTRAVENOUS | Status: DC
  Filled 2023-01-05: qty 10

## 2023-01-05 MED ORDER — ACETAMINOPHEN 325 MG PO TABS: 650.0000 mg | ORAL_TABLET | Freq: Once | ORAL | Status: DC

## 2023-01-05 MED ORDER — DIPHENHYDRAMINE HCL 25 MG PO CAPS: 25.0000 mg | ORAL_CAPSULE | Freq: Once | ORAL | Status: DC

## 2023-01-07 ENCOUNTER — Ambulatory Visit: Payer: Medicaid Other

## 2023-01-07 MED ORDER — SODIUM CHLORIDE 0.9 % IV SOLN
200.0000 mg | Freq: Once | INTRAVENOUS | Status: DC
  Filled 2023-01-07: qty 10

## 2023-01-07 MED ORDER — ACETAMINOPHEN 325 MG PO TABS: 650.0000 mg | ORAL_TABLET | Freq: Once | ORAL | Status: DC

## 2023-01-07 MED ORDER — DIPHENHYDRAMINE HCL 25 MG PO CAPS: 25.0000 mg | ORAL_CAPSULE | Freq: Once | ORAL | Status: DC

## 2023-01-09 ENCOUNTER — Ambulatory Visit: Payer: Medicaid Other

## 2023-01-09 MED ORDER — DIPHENHYDRAMINE HCL 25 MG PO CAPS: 25.0000 mg | ORAL_CAPSULE | Freq: Once | ORAL | Status: DC

## 2023-01-09 MED ORDER — SODIUM CHLORIDE 0.9 % IV SOLN
200.0000 mg | Freq: Once | INTRAVENOUS | Status: DC
  Filled 2023-01-09: qty 10

## 2023-01-09 MED ORDER — ACETAMINOPHEN 325 MG PO TABS: 650.0000 mg | ORAL_TABLET | Freq: Once | ORAL | Status: DC

## 2023-01-12 ENCOUNTER — Ambulatory Visit: Payer: Medicaid Other

## 2023-01-12 ENCOUNTER — Ambulatory Visit: Payer: Medicaid Other | Attending: Obstetrics

## 2023-01-12 DIAGNOSIS — O09293 Supervision of pregnancy with other poor reproductive or obstetric history, third trimester: Secondary | ICD-10-CM | POA: Diagnosis not present

## 2023-01-12 DIAGNOSIS — Z3A32 32 weeks gestation of pregnancy: Secondary | ICD-10-CM

## 2023-01-12 DIAGNOSIS — O365930 Maternal care for other known or suspected poor fetal growth, third trimester, not applicable or unspecified: Secondary | ICD-10-CM | POA: Insufficient documentation

## 2023-01-14 ENCOUNTER — Ambulatory Visit: Payer: Medicaid Other

## 2023-01-15 ENCOUNTER — Telehealth: Payer: Medicaid Other | Admitting: Obstetrics and Gynecology

## 2023-01-15 NOTE — Progress Notes (Signed)
Attempted to call patient at number listed in chart--sent to unidentified VM. Left message to call office back to reschedule appointment.   Amaris Delafuente RN on 01/15/23 at 1050

## 2023-01-16 ENCOUNTER — Encounter: Payer: Self-pay | Admitting: Obstetrics and Gynecology

## 2023-01-19 ENCOUNTER — Ambulatory Visit: Payer: Medicaid Other | Attending: Obstetrics

## 2023-01-19 ENCOUNTER — Other Ambulatory Visit: Payer: Self-pay | Admitting: *Deleted

## 2023-01-19 DIAGNOSIS — Z3A33 33 weeks gestation of pregnancy: Secondary | ICD-10-CM

## 2023-01-19 DIAGNOSIS — O99013 Anemia complicating pregnancy, third trimester: Secondary | ICD-10-CM

## 2023-01-19 DIAGNOSIS — D649 Anemia, unspecified: Secondary | ICD-10-CM

## 2023-01-19 DIAGNOSIS — O09293 Supervision of pregnancy with other poor reproductive or obstetric history, third trimester: Secondary | ICD-10-CM | POA: Diagnosis not present

## 2023-01-19 DIAGNOSIS — O365930 Maternal care for other known or suspected poor fetal growth, third trimester, not applicable or unspecified: Secondary | ICD-10-CM

## 2023-01-26 ENCOUNTER — Ambulatory Visit: Payer: Medicaid Other

## 2023-01-27 ENCOUNTER — Ambulatory Visit: Payer: Medicaid Other | Attending: Obstetrics

## 2023-01-27 ENCOUNTER — Ambulatory Visit (INDEPENDENT_AMBULATORY_CARE_PROVIDER_SITE_OTHER): Payer: Medicaid Other | Admitting: Obstetrics and Gynecology

## 2023-01-27 ENCOUNTER — Encounter: Payer: Self-pay | Admitting: Obstetrics and Gynecology

## 2023-01-27 ENCOUNTER — Other Ambulatory Visit: Payer: Self-pay

## 2023-01-27 VITALS — BP 120/70 | HR 87 | Wt 174.0 lb

## 2023-01-27 DIAGNOSIS — O09293 Supervision of pregnancy with other poor reproductive or obstetric history, third trimester: Secondary | ICD-10-CM

## 2023-01-27 DIAGNOSIS — O365930 Maternal care for other known or suspected poor fetal growth, third trimester, not applicable or unspecified: Secondary | ICD-10-CM | POA: Insufficient documentation

## 2023-01-27 DIAGNOSIS — O99013 Anemia complicating pregnancy, third trimester: Secondary | ICD-10-CM | POA: Diagnosis not present

## 2023-01-27 DIAGNOSIS — Z3A34 34 weeks gestation of pregnancy: Secondary | ICD-10-CM

## 2023-01-27 DIAGNOSIS — O365990 Maternal care for other known or suspected poor fetal growth, unspecified trimester, not applicable or unspecified: Secondary | ICD-10-CM

## 2023-01-27 DIAGNOSIS — D649 Anemia, unspecified: Secondary | ICD-10-CM | POA: Diagnosis not present

## 2023-01-27 DIAGNOSIS — Z3492 Encounter for supervision of normal pregnancy, unspecified, second trimester: Secondary | ICD-10-CM

## 2023-01-27 NOTE — Progress Notes (Signed)
   PRENATAL VISIT NOTE  Subjective:  Stacey Woods is a 22 y.o. G2P1001 at [redacted]w[redacted]d being seen today for ongoing prenatal care.  She is currently monitored for the following issues for this high-risk pregnancy and has Fetal growth restriction antepartum; Supervision of low-risk pregnancy; and Anemia of pregnancy on their problem list.  Patient reports no complaints.  Contractions: Not present. Vag. Bleeding: None.  Movement: Present. Denies leaking of fluid.   The following portions of the patient's history were reviewed and updated as appropriate: allergies, current medications, past family history, past medical history, past social history, past surgical history and problem list.   Objective:   Vitals:   01/27/23 1056  BP: 120/70  Pulse: 87  Weight: 174 lb (78.9 kg)    Fetal Status: Fetal Heart Rate (bpm): 135 Fundal Height: 34 cm Movement: Present     General:  Alert, oriented and cooperative. Patient is in no acute distress.  Skin: Skin is warm and dry. No rash noted.   Cardiovascular: Normal heart rate noted  Respiratory: Normal respiratory effort, no problems with respiration noted  Abdomen: Soft, gravid, appropriate for gestational age.  Pain/Pressure: Absent     Pelvic: Cervical exam deferred        Extremities: Normal range of motion.  Edema: None  Mental Status: Normal mood and affect. Normal behavior. Normal judgment and thought content.   Assessment and Plan:  Pregnancy: G2P1001 at [redacted]w[redacted]d 1. Encounter for supervision of low-risk pregnancy in second trimester Cultures next visit  2. Fetal growth restriction antepartum Now resolved - 10/7 - 15%ile, AC 23%ile, normal AFI.  Q3wk [place] until delivery.   3. Anemia during pregnancy in third trimester Recommended to do IV iron - pt has been contacted by them just has not yet scheduled.   Preterm labor symptoms and general obstetric precautions including but not limited to vaginal bleeding, contractions, leaking of fluid and  fetal movement were reviewed in detail with the patient. Please refer to After Visit Summary for other counseling recommendations.   No follow-ups on file.  Future Appointments  Date Time Provider Department Center  01/27/2023  3:30 PM [facility] US6 [facility]  02/02/2023  1:30 PM [facility] US4 [facility]  02/09/2023  2:15 PM [facility] NURSE [facility]  02/09/2023  2:30 PM [facility] US4 [facility]    Milas Hock, MD

## 2023-02-02 ENCOUNTER — Ambulatory Visit: Payer: Medicaid Other | Attending: Obstetrics

## 2023-02-02 ENCOUNTER — Other Ambulatory Visit: Payer: Self-pay

## 2023-02-02 DIAGNOSIS — O365930 Maternal care for other known or suspected poor fetal growth, third trimester, not applicable or unspecified: Secondary | ICD-10-CM | POA: Diagnosis present

## 2023-02-02 DIAGNOSIS — Z3A35 35 weeks gestation of pregnancy: Secondary | ICD-10-CM

## 2023-02-02 DIAGNOSIS — O09293 Supervision of pregnancy with other poor reproductive or obstetric history, third trimester: Secondary | ICD-10-CM | POA: Diagnosis not present

## 2023-02-02 DIAGNOSIS — O99013 Anemia complicating pregnancy, third trimester: Secondary | ICD-10-CM

## 2023-02-02 DIAGNOSIS — D649 Anemia, unspecified: Secondary | ICD-10-CM

## 2023-02-06 ENCOUNTER — Telehealth: Payer: Self-pay | Admitting: Pharmacy Technician

## 2023-02-06 ENCOUNTER — Other Ambulatory Visit: Payer: Self-pay

## 2023-02-06 ENCOUNTER — Other Ambulatory Visit (HOSPITAL_COMMUNITY)
Admission: RE | Admit: 2023-02-06 | Discharge: 2023-02-06 | Disposition: A | Discharge status: Home / Self Care | Payer: Medicaid Other | Source: Ambulatory Visit | Attending: Obstetrics and Gynecology | Admitting: Obstetrics and Gynecology

## 2023-02-06 ENCOUNTER — Ambulatory Visit (INDEPENDENT_AMBULATORY_CARE_PROVIDER_SITE_OTHER): Payer: Medicaid Other | Admitting: Obstetrics and Gynecology

## 2023-02-06 VITALS — BP 124/74 | HR 104 | Wt 177.0 lb

## 2023-02-06 DIAGNOSIS — Z3A36 36 weeks gestation of pregnancy: Secondary | ICD-10-CM

## 2023-02-06 DIAGNOSIS — O365930 Maternal care for other known or suspected poor fetal growth, third trimester, not applicable or unspecified: Secondary | ICD-10-CM

## 2023-02-06 DIAGNOSIS — O99013 Anemia complicating pregnancy, third trimester: Secondary | ICD-10-CM

## 2023-02-06 DIAGNOSIS — Z3492 Encounter for supervision of normal pregnancy, unspecified, second trimester: Secondary | ICD-10-CM

## 2023-02-06 MED ORDER — FERROUS SULFATE 324 MG PO TBEC: 324.0000 mg | DELAYED_RELEASE_TABLET | Freq: Every day | ORAL | 1 refills | Status: DC

## 2023-02-06 NOTE — Progress Notes (Unsigned)
    PRENATAL VISIT NOTE  Subjective:  Stacey Woods is a 22 y.o. G2P1001 at [redacted]w[redacted]d being seen today for ongoing prenatal care.  She is currently monitored for the following issues for this high-risk pregnancy and has Fetal growth restriction antepartum; Supervision of low-risk pregnancy; and Anemia of pregnancy on their problem list.  Patient reports  lethargy .  Contractions: Not present. Vag. Bleeding: None.  Movement: Present. Denies leaking of fluid.   The following portions of the patient's history were reviewed and updated as appropriate: allergies, current medications, past family history, past medical history, past social history, past surgical history and problem list.   Objective:   Vitals:   02/06/23 1136  BP: 124/74  Pulse: (!) 104  Weight: 177 lb (80.3 kg)    Fetal Status: Fetal Heart Rate (bpm): 124   Movement: Present  Presentation: Vertex  General:  Alert, oriented and cooperative. Patient is in no acute distress.  Skin: Skin is warm and dry. No rash noted.   Cardiovascular: Normal heart rate noted  Respiratory: Normal respiratory effort, no problems with respiration noted  Abdomen: Soft, gravid, appropriate for gestational age.  Pain/Pressure: Present     Pelvic: Cervical exam performed in the presence of a chaperone Dilation: 1 Effacement (%): 50 Station: -3  Extremities: Normal range of motion.  Edema: None  Mental Status: Normal mood and affect. Normal behavior. Normal judgment and thought content.   Assessment and Plan:  Pregnancy: G2P1001 at [redacted]w[redacted]d 1. Encounter for supervision of low-risk pregnancy in second trimester Unsure about birth control - GC/Chlamydia probe amp ([place])not at [facility] - Culture, beta strep (group b only)  2. Anemia during pregnancy in third trimester Patient has not gone to IV iron appointments. Importance of getting the IV iron d/w her, especially in terms of her lethargy and hopefully avoiding need for blood transfusion. Patient  amenable to IV iron. Orders placed to try and get IV iron scheduled again. In the interim, pt amenable to doing po iron  3. Poor fetal growth affecting management of mother in third trimester, single or unspecified fetus Resolved FGR. Follow up u/s early next week re: delivery timing. Continue with qwk testing 10/21: cepahlic, 8/8, afi 13 10/7: 15%, 2013gm, ac 23%, UA wnl ***  Preterm labor symptoms and general obstetric precautions including but not limited to vaginal bleeding, contractions, leaking of fluid and fetal movement were reviewed in detail with the patient. Please refer to After Visit Summary for other counseling recommendations.   Return in about 1 week (around 02/13/2023) for low risk ob, in person, md or app.  Future Appointments  Date Time Provider Department Center  02/09/2023  2:15 PM [facility] NURSE [facility]  02/09/2023  2:30 PM [facility] US4 [facility]  02/16/2023  3:15 PM Sue Lush, FNP [facility]     Bing, MD

## 2023-02-06 NOTE — Telephone Encounter (Signed)
Dr. Vergie Living, [facility] note:  Patient will be scheduled as soon as possible.  Auth Submission: NO AUTH NEEDED Site of care: Site of care: [facility] Payer: [employer] medicaid Medication & CPT/J Code(s) submitted: Venofer (Iron Sucrose) J1756 Route of submission (phone, fax, portal):  Phone # Fax # Auth type: Buy/Bill PB Units/visits requested: 3 Reference number:  Approval from: 1025/24 to 04/14/23

## 2023-02-09 ENCOUNTER — Encounter: Payer: Self-pay | Admitting: Obstetrics and Gynecology

## 2023-02-09 ENCOUNTER — Other Ambulatory Visit: Payer: Self-pay | Admitting: Maternal & Fetal Medicine

## 2023-02-09 ENCOUNTER — Ambulatory Visit: Payer: Medicaid Other | Admitting: *Deleted

## 2023-02-09 ENCOUNTER — Encounter: Payer: Self-pay | Admitting: *Deleted

## 2023-02-09 ENCOUNTER — Other Ambulatory Visit: Payer: Self-pay

## 2023-02-09 ENCOUNTER — Ambulatory Visit: Payer: Medicaid Other | Attending: Obstetrics and Gynecology

## 2023-02-09 DIAGNOSIS — O365930 Maternal care for other known or suspected poor fetal growth, third trimester, not applicable or unspecified: Secondary | ICD-10-CM

## 2023-02-09 DIAGNOSIS — O99013 Anemia complicating pregnancy, third trimester: Secondary | ICD-10-CM | POA: Diagnosis not present

## 2023-02-09 DIAGNOSIS — D649 Anemia, unspecified: Secondary | ICD-10-CM | POA: Diagnosis not present

## 2023-02-09 DIAGNOSIS — Z3A36 36 weeks gestation of pregnancy: Secondary | ICD-10-CM

## 2023-02-09 DIAGNOSIS — O99820 Streptococcus B carrier state complicating pregnancy: Secondary | ICD-10-CM | POA: Insufficient documentation

## 2023-02-09 DIAGNOSIS — O09293 Supervision of pregnancy with other poor reproductive or obstetric history, third trimester: Secondary | ICD-10-CM

## 2023-02-09 LAB — CULTURE, BETA STREP (GROUP B ONLY): Strep Gp B Culture: POSITIVE — AB

## 2023-02-09 NOTE — Progress Notes (Signed)
OB Note Dr. Judeth Cornfield called me and patient's u/s today shows FGR and he recommends 37wk delivery, but patient wanted to wait until 11/4 for delivery. I called patient re: delivery timing and FOB wont be back until Monday. Patient would like to do 11/4 @ 2345 for IOL. L&D called and it was scheduled. Pt aware of interim appts at the clinic  [place] Bing, Montez Hageman MD Attending Center for [facility] (Faculty Practice) 02/09/2023 Time: [phone]

## 2023-02-09 NOTE — Procedures (Signed)
Stacey Woods 2000-12-14 [redacted]w[redacted]d  Fetus A Non-Stress Test Interpretation for 02/09/23  Indication: IUGR  Fetal Heart Rate A Mode: External Baseline Rate (A): 130 bpm Variability: Moderate Accelerations: 15 x 15 Decelerations: None Multiple birth?: No  Uterine Activity Mode: Palpation, Toco Contraction Frequency (min): none Resting Tone Palpated: Relaxed  Interpretation (Fetal Testing) Nonstress Test Interpretation: Reactive Overall Impression: Reassuring for gestational age Comments: Dr. Judeth Cornfield reviewed tracing

## 2023-02-10 LAB — GC/CHLAMYDIA PROBE AMP (~~LOC~~) NOT AT ARMC
Chlamydia: NEGATIVE
Comment: NEGATIVE
Comment: NORMAL
Neisseria Gonorrhea: NEGATIVE

## 2023-02-11 ENCOUNTER — Telehealth (HOSPITAL_COMMUNITY): Payer: Self-pay | Admitting: *Deleted

## 2023-02-11 ENCOUNTER — Encounter (HOSPITAL_COMMUNITY): Payer: Self-pay | Admitting: *Deleted

## 2023-02-11 NOTE — Telephone Encounter (Signed)
Preadmission screen  

## 2023-02-12 ENCOUNTER — Ambulatory Visit: Payer: Medicaid Other | Attending: Obstetrics and Gynecology

## 2023-02-16 ENCOUNTER — Encounter: Payer: Medicaid Other | Admitting: Obstetrics and Gynecology

## 2023-02-17 ENCOUNTER — Encounter (HOSPITAL_COMMUNITY): Payer: Self-pay | Admitting: Obstetrics and Gynecology

## 2023-02-17 ENCOUNTER — Inpatient Hospital Stay (HOSPITAL_COMMUNITY)
Admission: RE | Admit: 2023-02-17 | Discharge: 2023-02-19 | DRG: 807 | Disposition: A | Discharge status: Home / Self Care | Payer: Medicaid Other | Attending: Obstetrics and Gynecology | Admitting: Obstetrics and Gynecology

## 2023-02-17 ENCOUNTER — Inpatient Hospital Stay (HOSPITAL_COMMUNITY): Payer: Medicaid Other | Admitting: Anesthesiology

## 2023-02-17 ENCOUNTER — Other Ambulatory Visit: Payer: Self-pay

## 2023-02-17 ENCOUNTER — Inpatient Hospital Stay (HOSPITAL_COMMUNITY): Payer: Medicaid Other

## 2023-02-17 DIAGNOSIS — O9902 Anemia complicating childbirth: Secondary | ICD-10-CM | POA: Diagnosis present

## 2023-02-17 DIAGNOSIS — O365930 Maternal care for other known or suspected poor fetal growth, third trimester, not applicable or unspecified: Principal | ICD-10-CM | POA: Diagnosis present

## 2023-02-17 DIAGNOSIS — Z87891 Personal history of nicotine dependence: Secondary | ICD-10-CM

## 2023-02-17 DIAGNOSIS — Z8249 Family history of ischemic heart disease and other diseases of the circulatory system: Secondary | ICD-10-CM

## 2023-02-17 DIAGNOSIS — O99824 Streptococcus B carrier state complicating childbirth: Secondary | ICD-10-CM | POA: Diagnosis present

## 2023-02-17 DIAGNOSIS — O365990 Maternal care for other known or suspected poor fetal growth, unspecified trimester, not applicable or unspecified: Principal | ICD-10-CM | POA: Diagnosis present

## 2023-02-17 DIAGNOSIS — O99820 Streptococcus B carrier state complicating pregnancy: Secondary | ICD-10-CM

## 2023-02-17 DIAGNOSIS — O326XX0 Maternal care for compound presentation, not applicable or unspecified: Secondary | ICD-10-CM | POA: Diagnosis present

## 2023-02-17 DIAGNOSIS — Z7982 Long term (current) use of aspirin: Secondary | ICD-10-CM

## 2023-02-17 DIAGNOSIS — Z3A37 37 weeks gestation of pregnancy: Secondary | ICD-10-CM

## 2023-02-17 DIAGNOSIS — O99019 Anemia complicating pregnancy, unspecified trimester: Secondary | ICD-10-CM | POA: Diagnosis present

## 2023-02-17 LAB — CBC
HCT: 25.3 % — ABNORMAL LOW (ref 36.0–46.0)
HCT: 25.6 % — ABNORMAL LOW (ref 36.0–46.0)
Hemoglobin: 7.2 g/dL — ABNORMAL LOW (ref 12.0–15.0)
Hemoglobin: 7.3 g/dL — ABNORMAL LOW (ref 12.0–15.0)
MCH: 19.3 pg — ABNORMAL LOW (ref 26.0–34.0)
MCH: 19.5 pg — ABNORMAL LOW (ref 26.0–34.0)
MCHC: 28.1 g/dL — ABNORMAL LOW (ref 30.0–36.0)
MCHC: 28.9 g/dL — ABNORMAL LOW (ref 30.0–36.0)
MCV: 67.5 fL — ABNORMAL LOW (ref 80.0–100.0)
MCV: 68.4 fL — ABNORMAL LOW (ref 80.0–100.0)
Platelets: 216 10*3/uL (ref 150–400)
Platelets: 244 10*3/uL (ref 150–400)
RBC: 3.74 MIL/uL — ABNORMAL LOW (ref 3.87–5.11)
RBC: 3.75 MIL/uL — ABNORMAL LOW (ref 3.87–5.11)
RDW: 18.3 % — ABNORMAL HIGH (ref 11.5–15.5)
RDW: 18.4 % — ABNORMAL HIGH (ref 11.5–15.5)
WBC: 13.4 10*3/uL — ABNORMAL HIGH (ref 4.0–10.5)
WBC: 9.7 10*3/uL (ref 4.0–10.5)
nRBC: 0.1 % (ref 0.0–0.2)
nRBC: 0.2 % (ref 0.0–0.2)

## 2023-02-17 LAB — PREPARE RBC (CROSSMATCH)

## 2023-02-17 LAB — RPR: RPR Ser Ql: NONREACTIVE

## 2023-02-17 MED ORDER — SODIUM CHLORIDE 0.9% IV SOLUTION: Freq: Once | INTRAVENOUS | Status: DC

## 2023-02-17 MED ORDER — EPHEDRINE 5 MG/ML INJ: 10.0000 mg | INTRAVENOUS | Status: DC | PRN

## 2023-02-17 MED ORDER — PRENATAL MULTIVITAMIN CH
1.0000 | ORAL_TABLET | Freq: Every day | ORAL | Status: DC
  Administered 2023-02-17 – 2023-02-19 (×3): 1 via ORAL
  Filled 2023-02-17 (×3): qty 1

## 2023-02-17 MED ORDER — SIMETHICONE 80 MG PO CHEW: 80.0000 mg | CHEWABLE_TABLET | ORAL | Status: DC | PRN

## 2023-02-17 MED ORDER — ACETAMINOPHEN 325 MG PO TABS: 650.0000 mg | ORAL_TABLET | ORAL | Status: DC | PRN

## 2023-02-17 MED ORDER — TERBUTALINE SULFATE 1 MG/ML IJ SOLN: 0.2500 mg | Freq: Once | INTRAMUSCULAR | Status: DC | PRN

## 2023-02-17 MED ORDER — ONDANSETRON HCL 4 MG/2ML IJ SOLN: 4.0000 mg | INTRAMUSCULAR | Status: DC | PRN

## 2023-02-17 MED ORDER — MISOPROSTOL 200 MCG PO TABS
1000.0000 ug | ORAL_TABLET | Freq: Once | ORAL | Status: AC
  Administered 2023-02-17: 1000 ug via RECTAL
  Filled 2023-02-17: qty 5

## 2023-02-17 MED ORDER — TETANUS-DIPHTH-ACELL PERTUSSIS 5-2.5-18.5 LF-MCG/0.5 IM SUSY: 0.5000 mL | PREFILLED_SYRINGE | Freq: Once | INTRAMUSCULAR | Status: DC

## 2023-02-17 MED ORDER — LIDOCAINE HCL (PF) 1 % IJ SOLN
INTRAMUSCULAR | Status: DC | PRN
  Administered 2023-02-17: 11 mL via EPIDURAL

## 2023-02-17 MED ORDER — FERROUS SULFATE 325 (65 FE) MG PO TABS
325.0000 mg | ORAL_TABLET | Freq: Every day | ORAL | Status: DC
  Administered 2023-02-18 – 2023-02-19 (×2): 325 mg via ORAL
  Filled 2023-02-17 (×2): qty 1

## 2023-02-17 MED ORDER — DIPHENHYDRAMINE HCL 25 MG PO CAPS: 25.0000 mg | ORAL_CAPSULE | Freq: Four times a day (QID) | ORAL | Status: DC | PRN

## 2023-02-17 MED ORDER — LACTATED RINGERS IV SOLN: INTRAVENOUS | Status: DC

## 2023-02-17 MED ORDER — FENTANYL CITRATE (PF) 100 MCG/2ML IJ SOLN: 50.0000 ug | INTRAMUSCULAR | Status: DC | PRN

## 2023-02-17 MED ORDER — SOD CITRATE-CITRIC ACID 500-334 MG/5ML PO SOLN: 30.0000 mL | ORAL | Status: DC | PRN

## 2023-02-17 MED ORDER — DIPHENHYDRAMINE HCL 50 MG/ML IJ SOLN: 12.5000 mg | INTRAMUSCULAR | Status: DC | PRN

## 2023-02-17 MED ORDER — PHENYLEPHRINE 80 MCG/ML (10ML) SYRINGE FOR IV PUSH (FOR BLOOD PRESSURE SUPPORT)
80.0000 ug | PREFILLED_SYRINGE | INTRAVENOUS | Status: DC | PRN
Start: 2023-02-17 — End: 2023-02-17

## 2023-02-17 MED ORDER — PENICILLIN G POT IN DEXTROSE 60000 UNIT/ML IV SOLN
3.0000 10*6.[IU] | INTRAVENOUS | Status: DC
  Administered 2023-02-17 (×2): 3 10*6.[IU] via INTRAVENOUS
  Filled 2023-02-17 (×2): qty 50

## 2023-02-17 MED ORDER — LIDOCAINE HCL (PF) 1 % IJ SOLN: 30.0000 mL | INTRAMUSCULAR | Status: DC | PRN

## 2023-02-17 MED ORDER — OXYTOCIN BOLUS FROM INFUSION
333.0000 mL | Freq: Once | INTRAVENOUS | Status: AC
  Administered 2023-02-17: 333 mL via INTRAVENOUS

## 2023-02-17 MED ORDER — IBUPROFEN 600 MG PO TABS
600.0000 mg | ORAL_TABLET | Freq: Four times a day (QID) | ORAL | Status: DC
  Administered 2023-02-17 – 2023-02-18 (×5): 600 mg via ORAL
  Filled 2023-02-17 (×9): qty 1

## 2023-02-17 MED ORDER — FENTANYL-BUPIVACAINE-NACL 0.5-0.125-0.9 MG/250ML-% EP SOLN
12.0000 mL/h | EPIDURAL | Status: DC | PRN
  Administered 2023-02-17: 12 mL/h via EPIDURAL
  Filled 2023-02-17: qty 250

## 2023-02-17 MED ORDER — PHENYLEPHRINE 80 MCG/ML (10ML) SYRINGE FOR IV PUSH (FOR BLOOD PRESSURE SUPPORT)
80.0000 ug | PREFILLED_SYRINGE | INTRAVENOUS | Status: DC | PRN
Start: 2023-02-17 — End: 2023-02-17
  Filled 2023-02-17: qty 10

## 2023-02-17 MED ORDER — LACTATED RINGERS IV SOLN
500.0000 mL | Freq: Once | INTRAVENOUS | Status: AC
  Administered 2023-02-17: 500 mL via INTRAVENOUS

## 2023-02-17 MED ORDER — OXYTOCIN-SODIUM CHLORIDE 30-0.9 UT/500ML-% IV SOLN: 2.5000 [IU]/h | INTRAVENOUS | Status: DC

## 2023-02-17 MED ORDER — LACTATED RINGERS IV SOLN: 500.0000 mL | INTRAVENOUS | Status: DC | PRN

## 2023-02-17 MED ORDER — DIBUCAINE (PERIANAL) 1 % EX OINT: 1.0000 | TOPICAL_OINTMENT | CUTANEOUS | Status: DC | PRN

## 2023-02-17 MED ORDER — BENZOCAINE-MENTHOL 20-0.5 % EX AERO
1.0000 | INHALATION_SPRAY | CUTANEOUS | Status: DC | PRN
  Administered 2023-02-17: 1 via TOPICAL
  Filled 2023-02-17: qty 56

## 2023-02-17 MED ORDER — ONDANSETRON HCL 4 MG/2ML IJ SOLN: 4.0000 mg | Freq: Four times a day (QID) | INTRAMUSCULAR | Status: DC | PRN

## 2023-02-17 MED ORDER — SODIUM CHLORIDE 0.9 % IV SOLN
5.0000 10*6.[IU] | Freq: Once | INTRAVENOUS | Status: AC
  Administered 2023-02-17: 5 10*6.[IU] via INTRAVENOUS
  Filled 2023-02-17: qty 5

## 2023-02-17 MED ORDER — WITCH HAZEL-GLYCERIN EX PADS: 1.0000 | MEDICATED_PAD | CUTANEOUS | Status: DC | PRN

## 2023-02-17 MED ORDER — OXYTOCIN-SODIUM CHLORIDE 30-0.9 UT/500ML-% IV SOLN
1.0000 m[IU]/min | INTRAVENOUS | Status: DC
  Administered 2023-02-17: 1 m[IU]/min via INTRAVENOUS
  Filled 2023-02-17: qty 500

## 2023-02-17 MED ORDER — ONDANSETRON HCL 4 MG PO TABS: 4.0000 mg | ORAL_TABLET | ORAL | Status: DC | PRN

## 2023-02-17 MED ORDER — SENNOSIDES-DOCUSATE SODIUM 8.6-50 MG PO TABS
2.0000 | ORAL_TABLET | Freq: Every day | ORAL | Status: DC
  Filled 2023-02-17 (×2): qty 2

## 2023-02-17 MED ORDER — TRANEXAMIC ACID-NACL 1000-0.7 MG/100ML-% IV SOLN
1000.0000 mg | INTRAVENOUS | Status: DC | PRN
  Administered 2023-02-17: 1000 mg via INTRAVENOUS
  Filled 2023-02-17: qty 100

## 2023-02-17 MED ORDER — COCONUT OIL OIL: 1.0000 | TOPICAL_OIL | Status: DC | PRN

## 2023-02-17 MED ORDER — ZOLPIDEM TARTRATE 5 MG PO TABS: 5.0000 mg | ORAL_TABLET | Freq: Every evening | ORAL | Status: DC | PRN

## 2023-02-17 NOTE — Anesthesia Postprocedure Evaluation (Signed)
Anesthesia Post Note  Patient: Stacey Woods  Procedure(s) Performed: AN AD HOC LABOR EPIDURAL     Patient location during evaluation: Mother Baby Anesthesia Type: Epidural Level of consciousness: awake and alert and oriented Pain management: satisfactory to patient Vital Signs Assessment: post-procedure vital signs reviewed and stable Respiratory status: respiratory function stable Cardiovascular status: stable Postop Assessment: no headache, no backache, epidural receding, patient able to bend at knees, no signs of nausea or vomiting, adequate PO intake and able to ambulate Anesthetic complications: no   No notable events documented.  Last Vitals:  Vitals:   02/17/23 1117 02/17/23 1223  BP: 117/64 132/80  Pulse: 70 78  Resp: 18 17  Temp: 37.3 C 36.9 C  SpO2: 100% 100%    Last Pain:  Vitals:   02/17/23 1314  TempSrc:   PainSc: 0-No pain   Pain Goal:                   [name]

## 2023-02-17 NOTE — Anesthesia Procedure Notes (Signed)
Epidural Patient location during procedure: OB Start time: 02/17/2023 5:28 AM End time: 02/17/2023 5:44 AM  Staffing Anesthesiologist: Lowella Curb, MD Performed: anesthesiologist   Preanesthetic Checklist Completed: patient identified, IV checked, site marked, risks and benefits discussed, surgical consent, monitors and equipment checked, pre-op evaluation and timeout performed  Epidural Patient position: sitting Prep: ChloraPrep Patient monitoring: heart rate, cardiac monitor, continuous pulse ox and blood pressure Approach: midline Location: L2-L3 Injection technique: LOR saline  Needle:  Needle type: Tuohy  Needle gauge: 17 G Needle length: 9 cm Needle insertion depth: 6 cm Catheter type: closed end flexible Catheter size: 20 Guage Catheter at skin depth: 10 cm Test dose: negative  Assessment Events: blood not aspirated, injection not painful, no injection resistance, no paresthesia and negative IV test  Additional Notes Reason for block:procedure for pain

## 2023-02-17 NOTE — Progress Notes (Signed)
Labor Progress Note  Stacey Woods is a 22 y.o. G2P1001 at [redacted]w[redacted]d presented for IOL for FGR.  S: patient resting comfortably in bed, no concerns at this time.    O:  BP 125/80   Pulse 93   Temp 98.7 F (37.1 C) (Oral)   Resp 16   Ht 5\' 4"  (1.626 m)   Wt 82 kg   LMP 05/28/2022   BMI 31.03 kg/m  EFM:120bpm/Moderate variability/ 15x15 accels/ ?prolongs vs low baseline intermittently  CAT: 1 Toco: regular, every 5 minutes   CVE: Dilation: 4.5 Effacement (%): 60 Cervical Position: Posterior Station: -2 Presentation: Vertex Exam by:: Dr. Judd Lien   A&P: 22 y.o. G2P1001 [redacted]w[redacted]d  here for IOL as above  #Labor: Progressing well. Of note the nurse and I have been reviewing pt's strip and infant will have intermittent episodes of good variability yet baseline in the low 100s, concerned about potential prolongs but infant with great variability and accelerations throughout this episodes. I feel as though this is just low baseline just outside of normal range and likely due to low hemoglobin and oxygen carrying capacity of maternal blood supply given Hgb of 7.2.  We discussed this with the patient and she was agreeable with AROM for now with continued discussion about  potential blood transfusion of 1U pRBC. AROM cf.  Mom and baby tolerated well. #Pain: planning epidural, warned about hypotension side effects #FWB: CAT 1 #GBS positive, PCN  #Anemia of Pregnancy Hgb on admission 7.2. 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 Patient would consent to blood products if needed. Type and cross pending   #FGR, fetal SGA -Follow up with peds for echo due to arrhythmia  [name], MD FMOB Fellow, Faculty practice [facility], Center for [facility] 02/17/23  5:32 AM

## 2023-02-17 NOTE — Discharge Summary (Signed)
Postpartum Discharge Summary  Date of Service updated***     Patient Name: Stacey Woods DOB: December 03, 2000 MRN: [id number]  Date of admission: 02/17/2023 Delivery date:02/17/2023 Delivering provider: Sundra Aland Date of discharge: 02/18/2023  Admitting diagnosis: IUGR (intrauterine growth restriction) affecting care of mother [O36.5990] Intrauterine pregnancy: [redacted]w[redacted]d     Secondary diagnosis:  Principal Problem:   SVD (spontaneous vaginal delivery) Active Problems:   IUGR (intrauterine growth restriction) affecting care of mother   Anemia of pregnancy   GBS (group B Streptococcus carrier), +RV culture, currently pregnant  Additional problems: none    Discharge diagnosis: Term Pregnancy Delivered and Anemia                                              Post partum procedures: none Augmentation: AROM and Pitocin Complications: None  Hospital course: Induction of Labor With Vaginal Delivery   22 y.o. yo G2P2002 at [redacted]w[redacted]d was admitted to the hospital 02/17/2023 for induction of labor.  Indication for induction:  fetal growth restriction .  Patient had an uncomplicated labor course. Membrane Rupture Time/Date: 5:02 AM,02/17/2023  Delivery Method:Vaginal, Spontaneous Operative Delivery:N/A Episiotomy: None Lacerations:  1st degree;Perineal;Periurethral;Labial Details of delivery can be found in separate delivery note.  Patient had a postpartum course complicated by receiving Venofer on PPD#1 due to chronic anemia of preg. Patient is discharged home 02/18/23.  Newborn Data: Birth date:02/17/2023 Birth time:9:15 AM Gender:Female Living status:Living Apgars:9 ,9  Weight:2650 g (5lb 13.5oz)  Magnesium Sulfate received: No BMZ received: No Rhophylac:N/A MMR:N/A T-DaP: declined Flu: No RSV Vaccine received: No Transfusion:No  Immunizations received: Immunization History  Administered Date(s) Administered   DTaP 11/01/1999, 05/02/2003, 07/04/2003, 11/08/2004   HIB (PRP-OMP)  11/01/1999, 07/04/2003   HPV Quadrivalent 01/14/2011, 05/08/2011, 02/16/2013   Hepatitis A, Ped/Adol-2 Dose 12/19/2005, 08/03/2006   Hepatitis B, PED/ADOLESCENT 11/01/1999, 05/02/2003, 07/04/2003   IPV 11/01/1999, 05/02/2003, 07/04/2003, 11/08/2004   Influenza Split 05/02/2003, 07/04/2003, 03/31/2006, 03/16/2009, 01/14/2011   Influenza,inj,quad, With Preservative 02/16/2013   MMR 05/02/2003, 11/08/2004   Meningococcal Conjugate 04/09/2016   Meningococcal polysaccharide vaccine (MPSV4) 01/14/2011   Pneumococcal Conjugate PCV 7 11/01/1999   Tdap 01/14/2011, 01/15/2021   Varicella 05/02/2003, 12/19/2005    Physical exam  Vitals:   02/18/23 0014 02/18/23 0511 02/18/23 1500 02/18/23 2121  BP: 122/68 123/78 117/75 135/78  Pulse: 80 77 66 77  Resp: 18 18 17 16   Temp: 98.1 F (36.7 C) 98 F (36.7 C) 98.4 F (36.9 C) 98.4 F (36.9 C)  TempSrc: Oral Oral Oral Oral  SpO2: 100% 100% 100% 100%  Weight:      Height:       General: alert and cooperative Lochia: appropriate Uterine Fundus: firm Incision: N/A DVT Evaluation: No evidence of DVT seen on physical exam. Labs: Lab Results  Component Value Date   WBC 11.4 (H) 02/18/2023   HGB 7.4 (L) 02/18/2023   HCT 25.5 (L) 02/18/2023   MCV 69.1 (L) 02/18/2023   PLT 204 02/18/2023       No data to display         Edinburgh Score:    02/18/2023    7:13 AM  Edinburgh Postnatal Depression Scale Screening Tool  I have been able to laugh and see the funny side of things. 0  I have looked forward with enjoyment to things. 0  I have blamed myself  unnecessarily when things went wrong. 0  I have been anxious or worried for no good reason. 0  I have felt scared or panicky for no good reason. 0  Things have been getting on top of me. 1  I have been so unhappy that I have had difficulty sleeping. 1  I have felt sad or miserable. 1  I have been so unhappy that I have been crying. 1  The thought of harming myself has occurred to me. 0   Edinburgh Postnatal Depression Scale Total 4   Edinburgh Postnatal Depression Scale Total: 4   After visit meds:  Allergies as of 02/18/2023       Reactions   No Known Allergies Other (See Comments)     Med Rec must be completed prior to using this [facility]***        Discharge home in stable condition Infant Feeding: Bottle Infant Disposition:home with mother Discharge instruction: per After Visit Summary and Postpartum booklet. Activity: Advance as tolerated. Pelvic rest for 6 weeks.  Diet: routine diet Future Appointments: Future Appointments  Date Time Provider Department Center  03/16/2023 10:35 AM Federico Flake, MD [facility]   Follow up Visit: Message sent to [facility] 11/5  Please schedule this patient for a In person postpartum visit in 4 weeks with the following provider: Any provider. Additional Postpartum F/U: none   Low risk pregnancy complicated by:  n/a Delivery mode:  Vaginal, Spontaneous Anticipated Birth Control:  Unsure   02/18/2023 Arabella Merles, CNM

## 2023-02-17 NOTE — H&P (Addendum)
OBSTETRIC ADMISSION HISTORY AND PHYSICAL  Stacey Woods is a 22 y.o. female G2P1001 with IUP at [redacted]w[redacted]d (dated by LMP, Estimated Date of Delivery: 03/04/23) presenting for IOL for FGR.   She reports +FMs, No LOF, no VB, no blurry vision, headaches or peripheral edema, and RUQ pain.    She plans on bottle feeding. She is unsure what she would like yet for birth control.  She received her prenatal care at [facility]     @[redacted]w[redacted]d , cephalic presentation, anterior placenta, EFW 2251g, 2.8%, AC 1.8%, HC 3.9%, normal dopplers, BPP 10/10   Prenatal History/Complications:  - FGR, EFW <3% Last [place]: At 36w- cephalic presentation, placenta anterior, EFW 2251 (2.8 %tile), AC 1.8% - anemia of pregnancy: most recent Hgb 8.4 on oral iron, recommended IV iron but not received   Past Medical History: Past Medical History:  Diagnosis Date   Anemia    Anemia of pregnancy 10/14/2022   9.7 at 28 weeks,Pt should take otc iron once a day. Repeat CBC in 3-4 weeks.   Fetal growth restriction antepartum 03/26/2021   GC (gonococcus)    Gonorrhea 09/11/2020   Pain of toe of right foot 07/29/2019   Pregnancy 09/07/2020   Pregnancy affected by fetal growth restriction 10/14/2022   EFW at 32.6: 20.9%, AC 3%   Turf toe 10/28/2019    Past Surgical History: Past Surgical History:  Procedure Laterality Date   SESAMOIDECTOMY Right 11/10/2019   Procedure: Right plantar plate repair;  Surgeon: Toni Arthurs, MD;  Location: [facility];  Service: Orthopedics;  Laterality: Right;    Obstetrical History: OB History     Gravida  2   Para  1   Term  1   Preterm      AB      Living  1      SAB      IAB      Ectopic      Multiple  0   Live Births  1           Social History Social History   Socioeconomic History   Marital status: Single    Spouse name: Not on file   Number of children: Not on file   Years of education: Not on file   Highest education level: Not on file   Occupational History   Occupation: [employer]  Tobacco Use   Smoking status: Former    Current packs/day: 0.00    Types: Cigarettes    Quit date: 08/28/2020    Years since quitting: 2.4   Smokeless tobacco: Never   Tobacco comments:    stopped smoking a week ago   Vaping Use   Vaping status: Never Used  Substance and Sexual Activity   Alcohol use: Never   Drug use: Never   Sexual activity: Not Currently    Birth control/protection: None  Other Topics Concern   Not on file  Social History Narrative   Not on file   Social Determinants of Health   Financial Resource Strain: Not on file  Food Insecurity: No Food Insecurity (02/17/2023)   Hunger Vital Sign    Worried About Running Out of Food in the Last Year: Never true    Ran Out of Food in the Last Year: Never true  Transportation Needs: No Transportation Needs (02/17/2023)   PRAPARE - Administrator, Civil Service (Medical): No    Lack of Transportation (Non-Medical): No  Physical Activity: Not on file  Stress:  Not on file  Social Connections: Not on file    Family History: Family History  Problem Relation Age of Onset   Hypertension Mother    Healthy Father    Cancer Paternal Grandmother     Allergies: Allergies  Allergen Reactions   No Known Allergies Other (See Comments)    Medications Prior to Admission  Medication Sig Dispense Refill Last Dose   aspirin 81 MG chewable tablet Chew 1 tablet (81 mg total) by mouth daily. 90 tablet 2 Past Week   Prenatal Vit-Fe Fumarate-FA (PRENATAL COMPLETE) 14-0.4 MG TABS Take 1 tablet by mouth daily. 90 tablet 1 02/16/2023 at 0800   ferrous sulfate 324 MG TBEC Take 1 tablet (324 mg total) by mouth daily with breakfast. 30 tablet 1      Review of Systems  All systems reviewed and negative except as stated in HPI.  Blood pressure 122/70, pulse 93, temperature 98.7 F (37.1 C), temperature source Oral, resp. rate 16, height 5\' 4"  (1.626 m), weight 82 kg, last  menstrual period 05/28/2022, unknown if currently breastfeeding. General appearance: alert and cooperative Lungs: breathing comfortably on room air Heart: regular rate Abdomen: soft, non-tender; gravid Extremities: no edema of bilateral lower extremities Presentation: cephalic confirmed on bedside US Fetal monitoringBaseline: 130 bpm, Variability: Good {> 6 bpm), and Accelerations: Reactive Uterine activity: contractions every ~5 minutes Dilation: 3 Effacement (%): 50 Station: -3 Exam by:: Dr. Haywood Lasso  Prenatal labs: ABO, Rh: --/--/PENDING (11/05 1610) Antibody: PENDING (11/05 0027) Rubella: 8.54 (07/09 1449) RPR: Non Reactive (09/03 0923)  HBsAg: Negative (07/09 1449)  HIV: Non Reactive (09/03 0923)  GBS: Positive/-- (10/25 1219)  2 hr Glucola normal Genetic screening  low risk, female  Anatomy US: normal anatomy, FGR  Last [place]: At 36w- cephalic presentation, placenta anterior, EFW 2251 (2.8 %tile), AC 1.8%  Prenatal Transfer Tool  Maternal Diabetes: No Genetic Screening: Normal Maternal Ultrasounds/Referrals: IUGR Fetal Ultrasounds or other Referrals:  Fetal echo Maternal Substance Abuse:  No Significant Maternal Medications:  None Significant Maternal Lab Results:  Group B Strep positive Number of Prenatal Visits:greater than 3 verified prenatal visits Other Comments:  None  Results for orders placed or performed during the hospital encounter of 02/17/23 (from the past 24 hour(s))  CBC   Collection Time: 02/17/23 12:27 AM  Result Value Ref Range   WBC 9.7 4.0 - 10.5 K/uL   RBC 3.74 (L) 3.87 - 5.11 MIL/uL   Hemoglobin 7.2 (L) 12.0 - 15.0 g/dL   HCT 96.0 (L) 45.4 - 09.8 %   MCV 68.4 (L) 80.0 - 100.0 fL   MCH 19.3 (L) 26.0 - 34.0 pg   MCHC 28.1 (L) 30.0 - 36.0 g/dL   RDW 11.9 (H) 14.7 - 82.9 %   Platelets 244 150 - 400 K/uL   nRBC 0.2 0.0 - 0.2 %  Type and screen   Collection Time: 02/17/23 12:27 AM  Result Value Ref Range   ABO/RH(D) PENDING    Antibody Screen  PENDING    Sample Expiration      02/20/2023,2359 Performed at [facility], [address], [place], [place] [address]     Patient Active Problem List   Diagnosis Date Noted   GBS (group B Streptococcus carrier), +RV culture, currently pregnant 02/09/2023   Supervision of low-risk pregnancy 10/14/2022   Anemia of pregnancy 10/14/2022   IUGR (intrauterine growth restriction) affecting care of mother 03/26/2021    Assessment/Plan:  CITLALIC NORLANDER is a 22 y.o. G2P1001  at [redacted]w[redacted]d here for IOL for FGR and fetal SGA  #Labor:Confirmed vertex positioning with bedside US, initial cervical exam 3/50 with regular contractions on toco. Initiating pitocin. Will recheck in ~3 hours and consider AROM for augmentation #Pain: Epidural upon request  #FWB: Cat 1  #ID:  GBS+ started on Penicillin G #MOF: Bottle #MOC: undecided  #Circ:  Desired  #Anemia of Pregnancy Hgb on admission 7.2. 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 Patient would consent to blood products if needed. Type and cross pending  #FGR, fetal SGA -Follow up with peds for echo due to arrhythmia  Mayra Reel, DO PGY 2 02/17/23  1:13 AM   Evaluation and management procedures were performed by the [facility] Medicine Resident under my supervision. I was immediately available for direct supervision, assistance and direction throughout this encounter.  I also confirm that I have verified the information documented in the resident's note, and that I have also personally reperformed the pertinent components of the physical exam and all of the medical decision making activities.  I have also made any necessary editorial changes.   Mittie Bodo, MD Family Medicine - Obstetrics Fellow

## 2023-02-17 NOTE — Anesthesia Preprocedure Evaluation (Signed)
Anesthesia Evaluation  Patient identified by MRN, date of birth, ID band Patient awake    Reviewed: Allergy & Precautions, H&P , NPO status , Patient's Chart, lab work & pertinent test results, reviewed documented beta blocker date and time   Airway Mallampati: I  TM Distance: >3 FB Neck ROM: full    Dental no notable dental hx. (+) Teeth Intact, Dental Advisory Given   Pulmonary neg pulmonary ROS, Patient abstained from smoking., former smoker   Pulmonary exam normal breath sounds clear to auscultation       Cardiovascular negative cardio ROS Normal cardiovascular exam Rhythm:regular Rate:Normal     Neuro/Psych negative neurological ROS  negative psych ROS   GI/Hepatic negative GI ROS, Neg liver ROS,,,  Endo/Other  negative endocrine ROS    Renal/GU negative Renal ROS  negative genitourinary   Musculoskeletal   Abdominal   Peds  Hematology  (+) Blood dyscrasia, anemia   Anesthesia Other Findings   Reproductive/Obstetrics (+) Pregnancy                              Anesthesia Physical Anesthesia Plan  ASA: 2  Anesthesia Plan: Epidural   Post-op Pain Management:    Induction:   PONV Risk Score and Plan: 2  Airway Management Planned: Natural Airway  Additional Equipment: None  Intra-op Plan:   Post-operative Plan:   Informed Consent: I have reviewed the patients History and Physical, chart, labs and discussed the procedure including the risks, benefits and alternatives for the proposed anesthesia with the patient or authorized representative who has indicated his/her understanding and acceptance.     Dental Advisory Given  Plan Discussed with: Anesthesiologist and CRNA  Anesthesia Plan Comments: (Labs checked- platelets confirmed with RN in room. Fetal heart tracing, per RN, reported to be stable enough for sitting procedure. Discussed epidural, and patient consents to  the procedure:  included risk of possible headache,backache, failed block, allergic reaction, and nerve injury. This patient was asked if she had any questions or concerns before the procedure started.)         Anesthesia Quick Evaluation

## 2023-02-17 NOTE — Progress Notes (Signed)
Nursing and I reviewed strip given concern about potential prolonged deceleration.  Pt has two subtle dips in HR from baseline of 115bpm to low 100s for a minute or two that is relived with position changes.  Nursing also gave small fluid bolus.  Otherwise infant with great variability and several accelerations between the rare events.  Will have mom drink some juice as well, but for now reassured and will continue with position changes PRN.  Will continue to titrate pit as tolerated, currently only at pit of 2.    Mittie Bodo, MD Family Medicine - Obstetrics Fellow

## 2023-02-18 LAB — CBC
HCT: 25.5 % — ABNORMAL LOW (ref 36.0–46.0)
Hemoglobin: 7.4 g/dL — ABNORMAL LOW (ref 12.0–15.0)
MCH: 20.1 pg — ABNORMAL LOW (ref 26.0–34.0)
MCHC: 29 g/dL — ABNORMAL LOW (ref 30.0–36.0)
MCV: 69.1 fL — ABNORMAL LOW (ref 80.0–100.0)
Platelets: 204 10*3/uL (ref 150–400)
RBC: 3.69 MIL/uL — ABNORMAL LOW (ref 3.87–5.11)
RDW: 18.4 % — ABNORMAL HIGH (ref 11.5–15.5)
WBC: 11.4 10*3/uL — ABNORMAL HIGH (ref 4.0–10.5)
nRBC: 0.2 % (ref 0.0–0.2)

## 2023-02-18 MED ORDER — SODIUM CHLORIDE 0.9 % IV SOLN
500.0000 mg | Freq: Once | INTRAVENOUS | Status: AC
  Administered 2023-02-18: 500 mg via INTRAVENOUS
  Filled 2023-02-18: qty 25

## 2023-02-18 MED ORDER — SODIUM CHLORIDE 0.9 % IV SOLN: INTRAVENOUS | Status: DC | PRN

## 2023-02-18 NOTE — Progress Notes (Addendum)
POSTPARTUM PROGRESS NOTE  Post Partum Day 1  Subjective:  Stacey Woods is a 22 y.o. [id number] s/p SVD at [redacted]w[redacted]d.  She reports she is doing well. No acute events overnight. She denies any problems with ambulating, voiding or po intake. Denies nausea or vomiting.  Pain is well controlled.  Lochia is appropriate.  Objective: Blood pressure 123/78, pulse 77, temperature 98 F (36.7 C), temperature source Oral, resp. rate 18, height 5\' 4"  (1.626 m), weight 82 kg, last menstrual period 05/28/2022, SpO2 100%, unknown if currently breastfeeding.  Physical Exam:  General: alert, cooperative and no distress Chest: no respiratory distress Heart:regular rate, distal pulses intact Abdomen: soft, nontender,  Uterine Fundus: firm, appropriately tender DVT Evaluation: No calf swelling or tenderness Extremities: no edema Skin: warm, dry  Recent Labs    02/17/23 1358 02/18/23 0636  HGB 7.3* 7.4*  HCT 25.3* 25.5*    Assessment/Plan: Stacey Woods is a 22 y.o. [id number] s/p SVD at [redacted]w[redacted]d   PPD#1 - Doing well  Routine postpartum care  Contraception: Declines  Feeding: Breast Dispo: Plan for discharge tomorrow.   LOS: 1 day   Derrel Nip, MD Attending Family Medicine Physician, [facility], [facility]  02/18/2023, 10:42 AM

## 2023-02-19 LAB — SURGICAL PATHOLOGY

## 2023-02-19 MED ORDER — IBUPROFEN 600 MG PO TABS: 600.0000 mg | ORAL_TABLET | Freq: Four times a day (QID) | ORAL | 0 refills | Status: DC | PRN

## 2023-02-19 NOTE — Plan of Care (Signed)
Problem: Education: Goal: Knowledge of General Education information will improve Description: Including pain rating scale, medication(s)/side effects and non-pharmacologic comfort measures 02/19/2023 0843 by Donne Hazel, LPN Outcome: Adequate for Discharge 02/19/2023 0842 by Donne Hazel, LPN Outcome: Progressing   Problem: Health Behavior/Discharge Planning: Goal: Ability to manage health-related needs will improve 02/19/2023 0843 by Donne Hazel, LPN Outcome: Adequate for Discharge 02/19/2023 0842 by Donne Hazel, LPN Outcome: Progressing   Problem: Clinical Measurements: Goal: Ability to maintain clinical measurements within normal limits will improve 02/19/2023 0843 by Donne Hazel, LPN Outcome: Adequate for Discharge 02/19/2023 0842 by Donne Hazel, LPN Outcome: Progressing Goal: Will remain free from infection 02/19/2023 0843 by Donne Hazel, LPN Outcome: Adequate for Discharge 02/19/2023 0842 by Donne Hazel, LPN Outcome: Progressing Goal: Diagnostic test results will improve 02/19/2023 0843 by Donne Hazel, LPN Outcome: Adequate for Discharge 02/19/2023 0842 by Donne Hazel, LPN Outcome: Progressing Goal: Respiratory complications will improve 02/19/2023 0843 by Donne Hazel, LPN Outcome: Adequate for Discharge 02/19/2023 0842 by Donne Hazel, LPN Outcome: Progressing Goal: Cardiovascular complication will be avoided 02/19/2023 0843 by Donne Hazel, LPN Outcome: Adequate for Discharge 02/19/2023 0842 by Donne Hazel, LPN Outcome: Progressing   Problem: Activity: Goal: Risk for activity intolerance will decrease 02/19/2023 0843 by Donne Hazel, LPN Outcome: Adequate for Discharge 02/19/2023 0842 by Donne Hazel, LPN Outcome: Progressing   Problem: Nutrition: Goal: Adequate nutrition will be maintained 02/19/2023 0843 by Donne Hazel, LPN Outcome: Adequate for Discharge 02/19/2023 0842 by Donne Hazel, LPN Outcome: Progressing    Problem: Coping: Goal: Level of anxiety will decrease 02/19/2023 0843 by Donne Hazel, LPN Outcome: Adequate for Discharge 02/19/2023 0842 by Donne Hazel, LPN Outcome: Progressing   Problem: Elimination: Goal: Will not experience complications related to bowel motility 02/19/2023 0843 by Donne Hazel, LPN Outcome: Adequate for Discharge 02/19/2023 0842 by Donne Hazel, LPN Outcome: Progressing Goal: Will not experience complications related to urinary retention 02/19/2023 0843 by Donne Hazel, LPN Outcome: Adequate for Discharge 02/19/2023 0842 by Donne Hazel, LPN Outcome: Progressing   Problem: Pain Management: Goal: General experience of comfort will improve 02/19/2023 0843 by Donne Hazel, LPN Outcome: Adequate for Discharge 02/19/2023 0842 by Donne Hazel, LPN Outcome: Progressing   Problem: Safety: Goal: Ability to remain free from injury will improve 02/19/2023 0843 by Donne Hazel, LPN Outcome: Adequate for Discharge 02/19/2023 0842 by Donne Hazel, LPN Outcome: Progressing   Problem: Skin Integrity: Goal: Risk for impaired skin integrity will decrease 02/19/2023 0843 by Donne Hazel, LPN Outcome: Adequate for Discharge 02/19/2023 0842 by Donne Hazel, LPN Outcome: Progressing   Problem: Education: Goal: Knowledge of condition will improve 02/19/2023 0843 by Donne Hazel, LPN Outcome: Adequate for Discharge 02/19/2023 0842 by Donne Hazel, LPN Outcome: Progressing Goal: Individualized Educational Video(s) 02/19/2023 0843 by Donne Hazel, LPN Outcome: Adequate for Discharge 02/19/2023 0842 by Donne Hazel, LPN Outcome: Progressing Goal: Individualized Newborn Educational Video(s) 02/19/2023 0843 by Donne Hazel, LPN Outcome: Adequate for Discharge 02/19/2023 0842 by Donne Hazel, LPN Outcome: Progressing   Problem: Activity: Goal: Will verbalize the importance of balancing activity with adequate rest periods 02/19/2023 0843 by  Donne Hazel, LPN Outcome: Adequate for Discharge 02/19/2023 0842 by Donne Hazel, LPN Outcome: Progressing Goal: Ability to tolerate increased activity will improve 02/19/2023 0843 by Donne Hazel, LPN Outcome: Adequate for  Discharge 02/19/2023 0842 by Donne Hazel, LPN Outcome: Progressing   Problem: Coping: Goal: Ability to identify and utilize available resources and services will improve 02/19/2023 0843 by Donne Hazel, LPN Outcome: Adequate for Discharge 02/19/2023 0842 by Donne Hazel, LPN Outcome: Progressing   Problem: Life Cycle: Goal: Chance of risk for complications during the postpartum period will decrease 02/19/2023 0843 by Donne Hazel, LPN Outcome: Adequate for Discharge 02/19/2023 0842 by Donne Hazel, LPN Outcome: Progressing   Problem: Role Relationship: Goal: Ability to demonstrate positive interaction with newborn will improve 02/19/2023 0843 by Donne Hazel, LPN Outcome: Adequate for Discharge 02/19/2023 0842 by Donne Hazel, LPN Outcome: Progressing   Problem: Skin Integrity: Goal: Demonstration of wound healing without infection will improve 02/19/2023 0843 by Donne Hazel, LPN Outcome: Adequate for Discharge 02/19/2023 0842 by Donne Hazel, LPN Outcome: Progressing

## 2023-02-19 NOTE — Plan of Care (Signed)
  Problem: Education: Goal: Knowledge of General Education information will improve Description: Including pain rating scale, medication(s)/side effects and non-pharmacologic comfort measures Outcome: Progressing   Problem: Health Behavior/Discharge Planning: Goal: Ability to manage health-related needs will improve Outcome: Progressing   Problem: Clinical Measurements: Goal: Ability to maintain clinical measurements within normal limits will improve Outcome: Progressing Goal: Will remain free from infection Outcome: Progressing Goal: Diagnostic test results will improve Outcome: Progressing Goal: Respiratory complications will improve Outcome: Progressing Goal: Cardiovascular complication will be avoided Outcome: Progressing   Problem: Activity: Goal: Risk for activity intolerance will decrease Outcome: Progressing   Problem: Nutrition: Goal: Adequate nutrition will be maintained Outcome: Progressing   Problem: Coping: Goal: Level of anxiety will decrease Outcome: Progressing   Problem: Elimination: Goal: Will not experience complications related to bowel motility Outcome: Progressing Goal: Will not experience complications related to urinary retention Outcome: Progressing   Problem: Pain Management: Goal: General experience of comfort will improve Outcome: Progressing   Problem: Safety: Goal: Ability to remain free from injury will improve Outcome: Progressing   Problem: Skin Integrity: Goal: Risk for impaired skin integrity will decrease Outcome: Progressing   Problem: Education: Goal: Knowledge of condition will improve Outcome: Progressing Goal: Individualized Educational Video(s) Outcome: Progressing Goal: Individualized Newborn Educational Video(s) Outcome: Progressing   Problem: Activity: Goal: Will verbalize the importance of balancing activity with adequate rest periods Outcome: Progressing Goal: Ability to tolerate increased activity will  improve Outcome: Progressing   Problem: Coping: Goal: Ability to identify and utilize available resources and services will improve Outcome: Progressing   Problem: Life Cycle: Goal: Chance of risk for complications during the postpartum period will decrease Outcome: Progressing   Problem: Role Relationship: Goal: Ability to demonstrate positive interaction with newborn will improve Outcome: Progressing   Problem: Skin Integrity: Goal: Demonstration of wound healing without infection will improve Outcome: Progressing

## 2023-02-20 LAB — TYPE AND SCREEN
ABO/RH(D): B POS
Antibody Screen: NEGATIVE
Unit division: 0
Unit division: 0

## 2023-02-20 LAB — BPAM RBC
Blood Product Expiration Date: 202411212359
Blood Product Expiration Date: 202411212359
Unit Type and Rh: 7300
Unit Type and Rh: 7300

## 2023-03-04 ENCOUNTER — Telehealth (HOSPITAL_COMMUNITY): Payer: Self-pay | Admitting: *Deleted

## 2023-03-04 NOTE — Telephone Encounter (Signed)
03/04/2023  Name: Stacey Woods MRN: [id number] DOB: 31-Mar-2001  Reason for Call:  Transition of Care Hospital Discharge Call  Contact Status: Patient Contact Status:  (Incomplete)  Language assistant needed: Interpreter Mode: Interpreter Not Needed        Follow-Up Questions: Do You Have Any Concerns About Your Health As You Heal From Delivery?: No Do You Have Any Concerns About Your Infants Health?: No  Edinburgh Postnatal Depression Scale:  In the Past 7 Days:   Patient said she was busy and requested a call later today to complete conversation.  PHQ2-9 Depression Scale:     Discharge Follow-up:    Post-discharge interventions: NA  Salena Saner, RN 03/04/2023  14:24

## 2023-03-05 ENCOUNTER — Telehealth (HOSPITAL_COMMUNITY): Payer: Self-pay | Admitting: *Deleted

## 2023-03-05 NOTE — Telephone Encounter (Signed)
03/05/2023  Name: MAKALEY PESTER MRN: [id number] DOB: 07/30/2000  Reason for Call:  Transition of Care Hospital Discharge Call  Contact Status: Patient Contact Status: Unable to contact  Language assistant needed:          Follow-Up Questions:    Inocente Salles Postnatal Depression Scale:  In the Past 7 Days:    PHQ2-9 Depression Scale:     Discharge Follow-up:    Post-discharge interventions: NA  Salena Saner, RN 03/05/2023 13:10

## 2023-03-16 ENCOUNTER — Ambulatory Visit: Payer: Medicaid Other | Admitting: Family Medicine

## 2023-03-16 ENCOUNTER — Encounter: Payer: Self-pay | Admitting: Advanced Practice Midwife

## 2023-03-16 DIAGNOSIS — R87612 Low grade squamous intraepithelial lesion on cytologic smear of cervix (LGSIL): Secondary | ICD-10-CM | POA: Insufficient documentation

## 2023-03-17 ENCOUNTER — Ambulatory Visit: Payer: Medicaid Other | Admitting: Advanced Practice Midwife

## 2023-03-17 ENCOUNTER — Encounter: Payer: Self-pay | Admitting: Advanced Practice Midwife

## 2023-03-17 ENCOUNTER — Other Ambulatory Visit: Payer: Self-pay

## 2023-03-17 ENCOUNTER — Encounter: Payer: Self-pay | Admitting: Obstetrics and Gynecology

## 2023-03-17 VITALS — BP 117/66 | HR 67 | Wt 169.2 lb

## 2023-03-17 DIAGNOSIS — R87612 Low grade squamous intraepithelial lesion on cytologic smear of cervix (LGSIL): Secondary | ICD-10-CM | POA: Diagnosis not present

## 2023-03-17 DIAGNOSIS — Z1331 Encounter for screening for depression: Secondary | ICD-10-CM | POA: Diagnosis not present

## 2023-03-17 NOTE — Progress Notes (Signed)
Post Partum Visit Note  Stacey Woods is a 22 y.o. G20P2002 female who presents for a postpartum visit. She is 4 weeks postpartum following a normal spontaneous vaginal delivery.  I have fully reviewed the prenatal and intrapartum course. The delivery was at 37/6 gestational weeks.  Anesthesia: epidural. Postpartum course has been uncomplicated . Baby is doing well uncomplaicated. Baby is feeding by bottle - Similac Neosure. Bleeding staining only. Bowel function is normal. Bladder function is normal. Patient is not sexually active. Contraception method is none. Postpartum depression screening: negative.   Upstream - 03/17/23 1142       Pregnancy Intention Screening   Does the patient want to become pregnant in the next year? No    Does the patient's partner want to become pregnant in the next year? No    Would the patient like to discuss contraceptive options today? No      Contraception Wrap Up   Current Method Abstinence    End Method Abstinence    Contraception Counseling Provided No    How was the end contraceptive method provided? N/A            The pregnancy intention screening data noted above was reviewed. Potential methods of contraception were discussed. The patient elected to proceed with Abstinence.   Edinburgh Postnatal Depression Scale - 03/17/23 1104       Edinburgh Postnatal Depression Scale:  In the Past 7 Days   I have been able to laugh and see the funny side of things. 0    I have looked forward with enjoyment to things. 0    I have blamed myself unnecessarily when things went wrong. 0    I have been anxious or worried for no good reason. 0    I have felt scared or panicky for no good reason. 0    Things have been getting on top of me. 0    I have been so unhappy that I have had difficulty sleeping. 0    I have felt sad or miserable. 0    I have been so unhappy that I have been crying. 0    The thought of harming myself has occurred to me. 0     Edinburgh Postnatal Depression Scale Total 0             Health Maintenance Due  Topic Date Due   INFLUENZA VACCINE  11/13/2022   COVID-19 Vaccine (1 - 2023-24 season) Never done    The following portions of the patient's history were reviewed and updated as appropriate: allergies, current medications, past family history, past medical history, past social history, past surgical history, and problem list.  Review of Systems Pertinent items are noted in HPI.  Objective:  BP 117/66   Pulse 67   Wt 169 lb 3 oz (76.7 kg)   LMP 05/28/2022   Breastfeeding No   BMI 29.04 kg/m    General:  alert, cooperative, appears stated age, and no distress   Breasts:  not indicated  Lungs: Nml rate and effort   Heart:  Regular rate  Abdomen: soft, non-tender; bowel sounds normal; no masses,  no organomegaly   Wound NA  GU exam:  not indicated       Assessment:    1. LGSIL on Pap smear of cervix - Colpo ASAP . Info given   Nml  postpartum exam.   Plan:   Essential components of care per ACOG recommendations:  1.  Mood  and well being: Patient with negative depression screening today. Reviewed local resources for support.  - Patient tobacco use? No.   - hx of drug use? No.    2. Infant care and feeding:  -Patient currently breastmilk feeding? No.  -Social determinants of health (SDOH) reviewed in EPIC. No concerns. The following needs were identified none  3. Sexuality, contraception and birth spacing - Patient does not want a pregnancy in the next year.  Desired family size is 2 children.  - Reviewed reproductive life planning. Reviewed contraceptive methods based on pt preferences and effectiveness.  Patient desired Abstinence today.   - Discussed birth spacing of 18 months  4. Sleep and fatigue -Encouraged family/partner/community support of 4 hrs of uninterrupted sleep to help with mood and fatigue  5. Physical Recovery  - Discussed patients delivery and complications.  She describes her labor as good. - Patient had a Vaginal, no problems at delivery. Patient had a 1st degree laceration. Perineal healing reviewed. Patient expressed understanding - Patient has urinary incontinence? No. - Patient is safe to resume physical and sexual activity  6.  Health Maintenance - HM due items addressed No -   - Last pap smear  Diagnosis  Date Value Ref Range Status  10/21/2022 - Low grade squamous intraepithelial lesion (LSIL) (A)     Pap smear not done at today's visit.  -Breast Cancer screening indicated? No.   7. Chronic Disease/Pregnancy Condition follow up: None  - PCP follow up. Given Get Care Now web address.   Dorathy Kinsman, CNM [facility] [employer], [facility]

## 2023-04-14 ENCOUNTER — Encounter: Payer: Self-pay | Admitting: Obstetrics and Gynecology

## 2023-04-14 ENCOUNTER — Ambulatory Visit: Payer: Medicaid Other | Admitting: Obstetrics and Gynecology

## 2023-04-16 NOTE — Progress Notes (Signed)
 Patient did not keep her colposcopy appointment for 04/14/2023.  Phone calls made but not able to contact patient.    Cornelia Copa MD Attending [facility] [employer] [facility])

## 2023-11-03 ENCOUNTER — Other Ambulatory Visit: Payer: Self-pay

## 2023-11-03 ENCOUNTER — Inpatient Hospital Stay (HOSPITAL_COMMUNITY)
Admission: AD | Admit: 2023-11-03 | Discharge: 2023-11-03 | Disposition: A | Discharge status: Home / Self Care | Attending: Obstetrics & Gynecology | Admitting: Obstetrics & Gynecology

## 2023-11-03 DIAGNOSIS — N912 Amenorrhea, unspecified: Secondary | ICD-10-CM | POA: Diagnosis present

## 2023-11-03 NOTE — Discharge Instructions (Signed)

## 2023-11-03 NOTE — MAU Note (Signed)
 Stacey Woods is a 23 y.o. at Unknown here in MAU reporting: she wants to confirm her pregnancy.  States has missed two cycles and has had a +HPT.  Denies VB or abdominal pain  LMP: 08/25/2023 Onset of complaint: today Pain score: 0 Vitals:   11/03/23 1812  BP: 117/66  Pulse: 66  Resp: 18  Temp: 98.3 F (36.8 C)  SpO2: 100%     FHT: NA  Lab orders placed from triage: None

## 2023-11-11 ENCOUNTER — Ambulatory Visit: Payer: Self-pay

## 2023-11-25 ENCOUNTER — Ambulatory Visit (HOSPITAL_COMMUNITY): Payer: Self-pay

## 2023-12-04 ENCOUNTER — Encounter (HOSPITAL_COMMUNITY): Payer: Self-pay | Admitting: *Deleted

## 2023-12-04 ENCOUNTER — Inpatient Hospital Stay (HOSPITAL_COMMUNITY)
Admission: AD | Admit: 2023-12-04 | Discharge: 2023-12-05 | Disposition: A | Discharge status: Home / Self Care | Attending: Obstetrics and Gynecology | Admitting: Obstetrics and Gynecology

## 2023-12-04 DIAGNOSIS — O219 Vomiting of pregnancy, unspecified: Secondary | ICD-10-CM

## 2023-12-04 DIAGNOSIS — Z3201 Encounter for pregnancy test, result positive: Secondary | ICD-10-CM | POA: Diagnosis present

## 2023-12-04 DIAGNOSIS — Z3490 Encounter for supervision of normal pregnancy, unspecified, unspecified trimester: Secondary | ICD-10-CM

## 2023-12-04 LAB — URINALYSIS, ROUTINE W REFLEX MICROSCOPIC
Bilirubin Urine: NEGATIVE
Glucose, UA: NEGATIVE mg/dL
Hgb urine dipstick: NEGATIVE
Ketones, ur: NEGATIVE mg/dL
Leukocytes,Ua: NEGATIVE
Nitrite: NEGATIVE
Protein, ur: NEGATIVE mg/dL
Specific Gravity, Urine: 1.028 (ref 1.005–1.030)
pH: 6 (ref 5.0–8.0)

## 2023-12-04 LAB — WET PREP, GENITAL
Sperm: NONE SEEN
Trich, Wet Prep: NONE SEEN
WBC, Wet Prep HPF POC: >=10 — AB (ref <10)
Yeast Wet Prep HPF POC: NONE SEEN

## 2023-12-04 LAB — POCT PREGNANCY, URINE: Preg Test, Ur: POSITIVE — AB

## 2023-12-04 MED ORDER — ONDANSETRON 4 MG PO TBDP
8.0000 mg | ORAL_TABLET | Freq: Once | ORAL | Status: AC
  Administered 2023-12-04: 8 mg via ORAL
  Filled 2023-12-04: qty 2

## 2023-12-04 NOTE — MAU Note (Signed)
 Pt says she irreg cycyles - LMP 08-26-2023. She thinks she's preg- no HPT  NO VB Had cramping - 1 week ago - 4/10. No pain now. Here bc of nausea and vomiting.

## 2023-12-05 DIAGNOSIS — Z3201 Encounter for pregnancy test, result positive: Secondary | ICD-10-CM

## 2023-12-05 MED ORDER — ONDANSETRON 4 MG PO TBDP: 4.0000 mg | ORAL_TABLET | Freq: Three times a day (TID) | ORAL | 0 refills | Status: AC | PRN

## 2023-12-05 MED ORDER — PRENATAL COMPLETE 14-0.4 MG PO TABS: 1.0000 | ORAL_TABLET | Freq: Every day | ORAL | 1 refills | Status: DC

## 2023-12-05 MED ORDER — METRONIDAZOLE 0.75 % VA GEL: 1.0000 | Freq: Every day | VAGINAL | 0 refills | Status: AC

## 2023-12-07 LAB — GC/CHLAMYDIA PROBE AMP (~~LOC~~) NOT AT ARMC
Chlamydia: NEGATIVE
Comment: NEGATIVE
Comment: NORMAL
Neisseria Gonorrhea: NEGATIVE

## 2023-12-17 ENCOUNTER — Telehealth

## 2023-12-17 NOTE — Progress Notes (Signed)
 Pt was not logged on to virtual waiting room for 08:15 OB Intake visit.  RN called pt telephone number in chart at 08:30, no answer.  Left voicemail with office call back number if pt would like to reschedule.  Front office notified.    Waddell, RN

## 2023-12-17 NOTE — Progress Notes (Deleted)
 New OB Intake  I connected with [facility]  on 12/17/23 at  8:15 AM EDT by MyChart Video Visit and verified that I am speaking with the correct person using two identifiers. Nurse is located at [facility] and pt is located at ***.  I discussed the limitations, risks, security and privacy concerns of performing an evaluation and management service by telephone and the availability of in person appointments. I also discussed with the patient that there may be a patient responsible charge related to this service. The patient expressed understanding and agreed to proceed.  I explained I am completing New OB Intake today. We discussed EDD of 06/01/24 based on LMP of 08/26/23. Pt is G3P2002. I reviewed her allergies, medications and Medical/Surgical/OB history.    Patient Active Problem List   Diagnosis Date Noted   LGSIL on Pap smear of cervix 03/16/2023     Concerns addressed today  Delivery Plans Plans to deliver at [facility] Discussed the nature of our practice with multiple providers including residents and students as well as female and female providers. Due to the size of the practice, the delivering provider may not be the same as those providing prenatal care.   Patient {Is/is not:9024} interested in water  birth.  MyChart/Babyscripts MyChart access verified. I explained pt will have some visits in office and some virtually. Babyscripts instructions given and order placed. Patient verifies receipt of registration text/e-mail. Account successfully created and app downloaded. If patient is a candidate for Optimized scheduling, add to sticky note.   Blood Pressure Cuff/Weight Scale {blood pressure cuff:24241} Explained after first prenatal appt pt will check weekly and document in Babyscripts. Patient {weight scale:28336}.  Anatomy US  Explained first scheduled US  will be around 19 weeks. Anatomy US  scheduled for *** at ***.  Is patient a CenteringPregnancy candidate?   {Accepted:19197::Accepted,Not a Candidate,Declined} Declined due to {Declined:19197::Schedule,Childcare,Group setting,Support person concern,Declined to say,Enrolled in [facility],***} Not a candidate due to {Not a Candidate:19197::DM,CHTN, medication controlled,Language barrier,>28 weeks,Multiple gestation (mono-mono or mono-di),Complex coordination of care needed,***} If accepted,    Is patient a Mom+Baby Combined Care candidate?  Not a candidate     Is patient a candidate for Babyscripts Optimization? {babyscripts:31704}   First visit review I reviewed new OB appt with patient. Explained pt will be seen by Dr. Fredirick at first visit on 12/28/23 at 9:15. Discussed [place] genetic screening with patient. *** Panorama and Horizon.. Routine prenatal labs {collected today/needed at new OB visit:9024}   Last Pap Diagnosis  Date Value Ref Range Status  10/21/2022 - Low grade squamous intraepithelial lesion (LSIL) (A)      Waddell LITTIE Burows, RN 12/17/2023  8:08 AM

## 2023-12-24 ENCOUNTER — Telehealth (INDEPENDENT_AMBULATORY_CARE_PROVIDER_SITE_OTHER)

## 2023-12-24 DIAGNOSIS — Z3A16 16 weeks gestation of pregnancy: Secondary | ICD-10-CM

## 2023-12-24 DIAGNOSIS — Z3492 Encounter for supervision of normal pregnancy, unspecified, second trimester: Secondary | ICD-10-CM | POA: Diagnosis not present

## 2023-12-24 DIAGNOSIS — Z3490 Encounter for supervision of normal pregnancy, unspecified, unspecified trimester: Secondary | ICD-10-CM | POA: Insufficient documentation

## 2023-12-24 MED ORDER — BLOOD PRESSURE KIT DEVI: 1.0000 | Freq: Once | 0 refills | Status: AC

## 2023-12-24 MED ORDER — GOJJI WEIGHT SCALE MISC: 1.0000 | Freq: Once | 0 refills | Status: AC

## 2023-12-24 NOTE — Patient Instructions (Signed)
 Safe Medications in Pregnancy   Acne:  Benzoyl Peroxide  Salicylic Acid   Backache/Headache:  Tylenol: 2 regular strength every 4 hours OR               2 Extra strength every 6 hours   Colds/Coughs/Allergies:  Benadryl (alcohol free) 25 mg every 6 hours as needed  Breath right strips  Claritin  Cepacol throat lozenges  Chloraseptic throat spray  Cold-Eeze- up to three times per day  Cough drops, alcohol free  Flonase (by prescription only)  Guaifenesin  Mucinex  Robitussin DM (plain only, alcohol free)  Saline nasal spray/drops  Sudafed (pseudoephedrine) & Actifed * use only after [redacted] weeks gestation and if you do not have high blood pressure  Tylenol  Vicks Vaporub  Zinc lozenges  Zyrtec   Constipation:  Colace  Ducolax suppositories  Fleet enema  Glycerin suppositories  Metamucil  Milk of magnesia  Miralax  Senokot  Smooth move tea   Diarrhea:  Kaopectate  Imodium A-D   *NO pepto Bismol   Hemorrhoids:  Anusol  Anusol HC  Preparation H  Tucks   Indigestion:  Tums  Maalox  Mylanta  Zantac  Pepcid   Insomnia:  Benadryl (alcohol free) 25mg  every 6 hours as needed  Tylenol PM  Unisom, no Gelcaps   Leg Cramps:  Tums  MagGel   Nausea/Vomiting:  Bonine  Dramamine  Emetrol  Ginger extract  Sea bands  Meclizine  Nausea medication to take during pregnancy:  Unisom (doxylamine succinate 25 mg tablets) Take one tablet daily at bedtime. If symptoms are not adequately controlled, the dose can be increased to a maximum recommended dose of two tablets daily (1/2 tablet in the morning, 1/2 tablet mid-afternoon and one at bedtime).  Vitamin B6 100mg  tablets. Take one tablet twice a day (up to 200 mg per day).   Skin Rashes:  Aveeno products  Benadryl cream or 25mg  every 6 hours as needed  Calamine Lotion  1% cortisone cream   Yeast infection:  Gyne-lotrimin 7  Monistat 7    **If taking multiple medications, please check labels to avoid  duplicating the same active ingredients  **take medication as directed on the label  ** Do not exceed 4000 mg of tylenol in 24 hours  **Do not take medications that contain aspirin or ibuprofen              CenteringPregnancy is a model of prenatal care that started 30 years ago and is used in about 600 practices around the [place]. You meet with a group of 8-12 women due around the same time as you. In Centering you will have individual time with the provider and meet as a group. There's much more time for discussion and learning. You will actually have much more time with your provider in Centering than in traditional prenatal care.? You will come directly into the Centering room and will not wait in the lobby so there is no wasted time. You will have 2-hour visits every 4 weeks then every 2 weeks. You will know your Centering prenatal appointments in advance. In your last month of pregnancy, you may also come in for some individual visits. Additional appointments can be scheduled if you need more care. Studies have shown that CenteringPregnancy improves birth outcomes. We have seen especially big improvements in fewer Black women delivering babies who are too small or born too early. Visit the website CenteringHealthcare for more information. Let your provider or clinic staff know if  you want to sign up or email [email or web address] for more information.   CenteringPregnancy Video

## 2023-12-24 NOTE — Progress Notes (Signed)
 New OB Intake  I connected with [facility]  on 12/24/23 at  2:15 PM EDT by MyChart Video Visit and verified that I am speaking with the correct person using two identifiers. Nurse is located at [facility] and pt is located at Work.  I discussed the limitations, risks, security and privacy concerns of performing an evaluation and management service by telephone and the availability of in person appointments. I also discussed with the patient that there may be a patient responsible charge related to this service. The patient expressed understanding and agreed to proceed.  I explained I am completing New OB Intake today. We discussed EDD of 06/01/24 based on LMP of 08/26/23. Pt is G3P2002. I reviewed her allergies, medications and Medical/Surgical/OB history.    Patient Active Problem List   Diagnosis Date Noted   Encounter for supervision of low-risk pregnancy, antepartum 12/24/2023   LGSIL on Pap smear of cervix 03/16/2023     Concerns addressed today  Delivery Plans Plans to deliver at [facility]. Discussed the nature of our practice with multiple providers including residents and students as well as female and female providers. Due to the size of the practice, the delivering provider may not be the same as those providing prenatal care.   Patient is not interested in water  birth.  MyChart/Babyscripts MyChart access verified. I explained pt will have some visits in office and some virtually. Babyscripts instructions given and order placed. Patient verifies receipt of registration text/e-mail. Account successfully created and app downloaded. If patient is a candidate for Optimized scheduling, add to sticky note.   Blood Pressure Cuff/Weight Scale Blood pressure cuff ordered for patient to pick-up from [employer]. Explained after first prenatal appt pt will check weekly and document in Babyscripts. Patient does not have weight scale; order sent to [facility], patient may track weight  weekly in Babyscripts.  Anatomy US  Explained first scheduled US  will be around 19 weeks. Anatomy US  scheduled for 01/29/24 at 1:00pm.  Is patient a CenteringPregnancy candidate?  Undecided, patient given information about Centering.   Is patient a Mom+Baby Combined Care candidate?  Not a candidate     Is patient a candidate for Babyscripts Optimization? Yes, undecided.  First visit review I reviewed new OB appt with patient. Explained pt will be seen by Nidia Daring, NP at first visit at 2:15pm. Discussed [place] genetic screening with patient. Pt desires Panorama test. Routine prenatal labs and Panorama to be collected at [facility].   Last Pap Diagnosis  Date Value Ref Range Status  10/21/2022 - Low grade squamous intraepithelial lesion (LSIL) (A)      Waddell LITTIE Burows, RN 12/24/2023  2:38 PM

## 2023-12-28 ENCOUNTER — Encounter: Payer: Self-pay | Admitting: Obstetrics and Gynecology

## 2023-12-28 ENCOUNTER — Other Ambulatory Visit: Payer: Self-pay

## 2023-12-28 ENCOUNTER — Ambulatory Visit (INDEPENDENT_AMBULATORY_CARE_PROVIDER_SITE_OTHER): Admitting: Obstetrics and Gynecology

## 2023-12-28 ENCOUNTER — Encounter: Admitting: Family Medicine

## 2023-12-28 VITALS — BP 105/68 | HR 80 | Wt 173.1 lb

## 2023-12-28 DIAGNOSIS — O09292 Supervision of pregnancy with other poor reproductive or obstetric history, second trimester: Secondary | ICD-10-CM | POA: Diagnosis not present

## 2023-12-28 DIAGNOSIS — Z3490 Encounter for supervision of normal pregnancy, unspecified, unspecified trimester: Secondary | ICD-10-CM

## 2023-12-28 DIAGNOSIS — Z1332 Encounter for screening for maternal depression: Secondary | ICD-10-CM

## 2023-12-28 DIAGNOSIS — Z3A17 17 weeks gestation of pregnancy: Secondary | ICD-10-CM

## 2023-12-28 MED ORDER — ASPIRIN 81 MG PO TBEC: 81.0000 mg | DELAYED_RELEASE_TABLET | Freq: Every day | ORAL | 2 refills | Status: DC

## 2023-12-28 MED ORDER — ACETAMINOPHEN 325 MG PO TABS
650.0000 mg | ORAL_TABLET | Freq: Four times a day (QID) | ORAL | 0 refills | Status: DC | PRN
Start: 2023-12-28 — End: 2023-12-28

## 2023-12-28 MED ORDER — FLUCONAZOLE 150 MG PO TABS: 150.0000 mg | ORAL_TABLET | Freq: Once | ORAL | 1 refills | Status: AC

## 2023-12-28 NOTE — Progress Notes (Signed)
 INITIAL PRENATAL VISIT  Subjective:   Stacey Woods is being seen today for her first obstetrical visit.  She is at [redacted]w[redacted]d gestation by LMP. Her obstetrical history is significant for hx IUGR. Relationship with FOB: significant other, not living together. Patient does not intend to breast feed. Pregnancy history fully reviewed.  Patient reports completed metrogel , reports vaginal itching  Objective:    Obstetric History OB History  Gravida Para Term Preterm AB Living  3 2 2  0 0 2  SAB IAB Ectopic Multiple Live Births  0 0 0 0 2    # Outcome Date GA Lbr Len/2nd Weight Sex Type Anes PTL Lv  3 Current           2 Term 02/17/23 [redacted]w[redacted]d 04:06 / 00:07 5 lb 13.5 oz (2.65 kg) M Vag-Spont EPI  LIV  1 Term 03/26/21 [redacted]w[redacted]d 01:54 / 00:06 5 lb 6.2 oz (2.444 kg) F Vag-Spont EPI  LIV    Past Medical History:  Diagnosis Date   Anemia of pregnancy 10/14/2022   9.7 at 28 weeks,Pt should take otc iron  once a day. Repeat CBC in 3-4 weeks.   Gonorrhea 09/11/2020   Pregnancy affected by fetal growth restriction 10/14/2022   EFW at 32.6: 20.9%, AC 3%   Turf toe 10/28/2019    Past Surgical History:  Procedure Laterality Date   SESAMOIDECTOMY Right 11/10/2019   Procedure: Right plantar plate repair;  Surgeon: Kit Rush, MD;  Location: [facility];  Service: Orthopedics;  Laterality: Right;    Current Outpatient Medications on File Prior to Visit  Medication Sig Dispense Refill   ondansetron  (ZOFRAN -ODT) 4 MG disintegrating tablet Take 1 tablet (4 mg total) by mouth every 8 (eight) hours as needed for nausea or vomiting. 15 tablet 0   Prenatal Vit-Fe Fumarate-FA (PRENATAL COMPLETE) 14-0.4 MG TABS Take 1 tablet by mouth daily. 90 tablet 1   No current facility-administered medications on file prior to visit.    Allergies  Allergen Reactions   No Known Allergies Other (See Comments)    Social History:  reports that she quit smoking about 3 years ago. Her smoking use  included cigarettes. She has never used smokeless tobacco. She reports that she does not drink alcohol and does not use drugs.  Family History  Problem Relation Age of Onset   Hypertension Mother    Healthy Father    Cancer Paternal Grandmother     The following portions of the patient's history were reviewed and updated as appropriate: allergies, current medications, past family history, past medical history, past social history, past surgical history and problem list.  Review of Systems Review of Systems  Genitourinary:        Vaginal itching   All other systems reviewed and are negative.   Physical Exam:  BP 105/68   Pulse 80   Wt 173 lb 1.6 oz (78.5 kg)   LMP 08/26/2023   BMI 29.71 kg/m  CONSTITUTIONAL: Well-developed, well-nourished female in no acute distress.  HENT:  Normocephalic, atraumatic.   NECK: Normal range of motion SKIN: Skin is warm and dry. MUSCULOSKELETAL: Normal range of motion NEUROLOGIC: Alert and oriented  PSYCHIATRIC: Normal mood and affect. Normal behavior.  RESPIRATORY: normal effort ABDOMEN: Soft PELVIC:deferred   Assessment:    Pregnancy: G3P2002  1. Encounter for supervision of low-risk pregnancy, antepartum (Primary) Bedside ultrasound cardiac activity shown to patient Discussed recommendation of ASA in pregnancy, rx sent  Desires PAP at next visit hx LGSIL no  colpo  - AFP, Serum, Open Spina Bifida - Hemoglobin A1c - PANORAMA PRENATAL TEST - HORIZON Basic Panel - CBC/D/Plt+RPR+Rh+ABO+RubIgG...   2. History of IUGR (intrauterine growth retardation) and stillbirth, currently pregnant, second trimester Previous deliveries 5lbs 13.5oz at 37.6 weeks 5lbs 6.2oz at 37.5 weeks     Plan:     Initial labs drawn. Prenatal vitamins. Problem list reviewed and updated. Reviewed in detail the nature of the practice with collaborative care between  Genetic screening discussed: NIPS/First trimester screen/Quad/AFP ordered. Role of  ultrasound in pregnancy discussed; Anatomy US : ordered.  Discussed clinic routines, schedule of care and testing, genetic screening options, involvement of students and residents under the direct supervision of APPs and doctors and presence of female providers. Pt verbalized understanding.  Future Appointments  Date Time Provider Department Center  01/25/2024  3:15 PM Cleatus Moccasin, MD [facility]  01/29/2024  1:00 PM [facility] PROVIDER 1 [facility]  01/29/2024  1:30 PM [facility] US5 [facility]    Delores Nidia CROME, FNP

## 2023-12-30 ENCOUNTER — Other Ambulatory Visit: Payer: Self-pay

## 2023-12-30 DIAGNOSIS — Z3490 Encounter for supervision of normal pregnancy, unspecified, unspecified trimester: Secondary | ICD-10-CM

## 2023-12-30 LAB — CBC/D/PLT+RPR+RH+ABO+RUBIGG...
Antibody Screen: NEGATIVE
Basophils Absolute: 0 x10E3/uL (ref 0.0–0.2)
Basos: 0 %
EOS (ABSOLUTE): 0.1 x10E3/uL (ref 0.0–0.4)
Eos: 1 %
HCV Ab: NONREACTIVE
HIV Screen 4th Generation wRfx: NONREACTIVE
Hematocrit: 34.7 % (ref 34.0–46.6)
Hemoglobin: 11.5 g/dL (ref 11.1–15.9)
Hepatitis B Surface Ag: NEGATIVE
Immature Grans (Abs): 0 x10E3/uL (ref 0.0–0.1)
Immature Granulocytes: 0 %
Lymphocytes Absolute: 1.9 x10E3/uL (ref 0.7–3.1)
Lymphs: 24 %
MCH: 28.3 pg (ref 26.6–33.0)
MCHC: 33.1 g/dL (ref 31.5–35.7)
MCV: 85 fL (ref 79–97)
Monocytes Absolute: 0.6 x10E3/uL (ref 0.1–0.9)
Monocytes: 8 %
Neutrophils Absolute: 5.2 x10E3/uL (ref 1.4–7.0)
Neutrophils: 67 %
Platelets: 256 x10E3/uL (ref 150–450)
RBC: 4.07 x10E6/uL (ref 3.77–5.28)
RDW: 13.8 % (ref 11.7–15.4)
RPR Ser Ql: NONREACTIVE
Rh Factor: POSITIVE
Rubella Antibodies, IGG: 8.08 {index}
WBC: 7.7 x10E3/uL (ref 3.4–10.8)

## 2023-12-30 LAB — AFP, SERUM, OPEN SPINA BIFIDA
AFP MoM: 8.75
AFP Value: 333.2 ng/mL
Gest. Age on Collection Date: 17 wk
Maternal Age At EDD: 24.9 a
OSBR Risk 1 IN: 10
Test Results:: POSITIVE — AB
Weight: 173 [lb_av]

## 2023-12-30 LAB — HEMOGLOBIN A1C
Est. average glucose Bld gHb Est-mCnc: 105 mg/dL
Hgb A1c MFr Bld: 5.3 % (ref 4.8–5.6)

## 2023-12-30 LAB — HCV INTERPRETATION

## 2023-12-31 ENCOUNTER — Ambulatory Visit: Admitting: Obstetrics and Gynecology

## 2023-12-31 ENCOUNTER — Other Ambulatory Visit: Payer: Self-pay

## 2023-12-31 ENCOUNTER — Encounter (HOSPITAL_COMMUNITY): Payer: Self-pay | Admitting: Obstetrics & Gynecology

## 2023-12-31 ENCOUNTER — Observation Stay (HOSPITAL_COMMUNITY)
Admission: AD | Admit: 2023-12-31 | Discharge: 2024-01-01 | Disposition: A | Discharge status: Home / Self Care | Attending: Obstetrics & Gynecology | Admitting: Obstetrics & Gynecology

## 2023-12-31 ENCOUNTER — Ambulatory Visit

## 2023-12-31 DIAGNOSIS — Z3A15 15 weeks gestation of pregnancy: Secondary | ICD-10-CM

## 2023-12-31 DIAGNOSIS — Z3492 Encounter for supervision of normal pregnancy, unspecified, second trimester: Secondary | ICD-10-CM | POA: Diagnosis not present

## 2023-12-31 DIAGNOSIS — O021 Missed abortion: Secondary | ICD-10-CM | POA: Diagnosis present

## 2023-12-31 DIAGNOSIS — Z3A14 14 weeks gestation of pregnancy: Secondary | ICD-10-CM | POA: Diagnosis not present

## 2023-12-31 DIAGNOSIS — Z3490 Encounter for supervision of normal pregnancy, unspecified, unspecified trimester: Secondary | ICD-10-CM

## 2023-12-31 LAB — COMPREHENSIVE METABOLIC PANEL WITH GFR
ALT: 11 U/L (ref 0–44)
AST: 22 U/L (ref 15–41)
Albumin: 2.9 g/dL — ABNORMAL LOW (ref 3.5–5.0)
Alkaline Phosphatase: 53 U/L (ref 38–126)
Anion gap: 13 (ref 5–15)
BUN: 7 mg/dL (ref 6–20)
CO2: 18 mmol/L — ABNORMAL LOW (ref 22–32)
Calcium: 8.6 mg/dL — ABNORMAL LOW (ref 8.9–10.3)
Chloride: 107 mmol/L (ref 98–111)
Creatinine, Ser: 0.52 mg/dL (ref 0.44–1.00)
GFR, Estimated: >60 mL/min (ref >60)
Glucose, Bld: 100 mg/dL — ABNORMAL HIGH (ref 70–99)
Potassium: 4.3 mmol/L (ref 3.5–5.1)
Sodium: 138 mmol/L (ref 135–145)
Total Bilirubin: 0.7 mg/dL (ref 0.0–1.2)
Total Protein: 6.1 g/dL — ABNORMAL LOW (ref 6.5–8.1)

## 2023-12-31 LAB — TYPE AND SCREEN
ABO/RH(D): B POS
Antibody Screen: NEGATIVE

## 2023-12-31 LAB — CBC
HCT: 36.3 % (ref 36.0–46.0)
Hemoglobin: 11.9 g/dL — ABNORMAL LOW (ref 12.0–15.0)
MCH: 27.5 pg (ref 26.0–34.0)
MCHC: 32.8 g/dL (ref 30.0–36.0)
MCV: 83.8 fL (ref 80.0–100.0)
Platelets: 251 K/uL (ref 150–400)
RBC: 4.33 MIL/uL (ref 3.87–5.11)
RDW: 13.4 % (ref 11.5–15.5)
WBC: 8 K/uL (ref 4.0–10.5)
nRBC: 0 % (ref 0.0–0.2)

## 2023-12-31 MED ORDER — OXYCODONE HCL 5 MG PO TABS: 10.0000 mg | ORAL_TABLET | ORAL | Status: DC | PRN

## 2023-12-31 MED ORDER — MISOPROSTOL 200 MCG PO TABS
400.0000 ug | ORAL_TABLET | Freq: Once | ORAL | Status: AC
  Administered 2023-12-31: 400 ug via VAGINAL
  Filled 2023-12-31: qty 2

## 2023-12-31 MED ORDER — DOCUSATE SODIUM 100 MG PO CAPS
100.0000 mg | ORAL_CAPSULE | Freq: Every day | ORAL | Status: DC
  Filled 2023-12-31: qty 1

## 2023-12-31 MED ORDER — MISOPROSTOL 200 MCG PO TABS
400.0000 ug | ORAL_TABLET | Freq: Once | ORAL | Status: AC
Start: 2023-12-31 — End: 2023-12-31
  Administered 2023-12-31: 400 ug via VAGINAL
  Filled 2023-12-31: qty 2

## 2023-12-31 MED ORDER — LACTATED RINGERS IV SOLN: 125.0000 mL/h | INTRAVENOUS | Status: DC

## 2023-12-31 MED ORDER — MISOPROSTOL 200 MCG PO TABS: 800.0000 ug | ORAL_TABLET | ORAL | Status: DC

## 2023-12-31 MED ORDER — ZOLPIDEM TARTRATE 5 MG PO TABS: 5.0000 mg | ORAL_TABLET | Freq: Every evening | ORAL | Status: DC | PRN

## 2023-12-31 MED ORDER — OXYCODONE HCL 5 MG PO TABS
5.0000 mg | ORAL_TABLET | ORAL | Status: DC | PRN
  Administered 2023-12-31: 5 mg via ORAL
  Filled 2023-12-31: qty 1

## 2023-12-31 MED ORDER — FENTANYL CITRATE (PF) 100 MCG/2ML IJ SOLN
50.0000 ug | INTRAMUSCULAR | Status: DC | PRN
  Administered 2023-12-31: 50 ug via INTRAVENOUS
  Filled 2023-12-31: qty 2

## 2023-12-31 MED ORDER — ACETAMINOPHEN 325 MG PO TABS
650.0000 mg | ORAL_TABLET | ORAL | Status: DC | PRN
  Administered 2024-01-01: 650 mg via ORAL
  Filled 2023-12-31: qty 2

## 2023-12-31 MED ORDER — MISOPROSTOL 200 MCG PO TABS
800.0000 ug | ORAL_TABLET | ORAL | Status: DC | PRN
  Administered 2023-12-31 – 2024-01-01 (×2): 800 ug via VAGINAL
  Filled 2023-12-31 (×2): qty 4

## 2023-12-31 MED ORDER — CALCIUM CARBONATE ANTACID 500 MG PO CHEW: 2.0000 | CHEWABLE_TABLET | ORAL | Status: DC | PRN

## 2023-12-31 NOTE — H&P (Signed)
 [facility] Gynecology Attending History and Physical  Stacey Woods is a 23 y.o. G3P2002 at [redacted]w[redacted]d who was diagnosed with intrauterine fetal demise today during her office visit.  Fetal demise measured around [redacted]w[redacted]d.  She was given options for management, she decided to proceed with induction of labor.  She currently denies any vaginal bleeding, abdominal pain, abnormal vaginal discharge, , fevers, chills, sweats, dysuria, nausea, vomiting, other GI or GU symptoms or other general symptoms. She is appropriately sad and has her family here for support.  Past Medical History:  Diagnosis Date   Anemia    Gonorrhea 09/11/2020   Turf toe 10/28/2019   Past Surgical History:  Procedure Laterality Date   SESAMOIDECTOMY Right 11/10/2019   Procedure: Right plantar plate repair;  Surgeon: Kit Rush, MD;  Location: [facility];  Service: Orthopedics;  Laterality: Right;   OB History  Gravida Para Term Preterm AB Living  3 2 2  0 0 2  SAB IAB Ectopic Multiple Live Births  0 0 0 0 2    # Outcome Date GA Lbr Len/2nd Weight Sex Type Anes PTL Lv  3 Current           2 Term 02/17/23 [redacted]w[redacted]d 04:06 / 00:07 2650 g M Vag-Spont EPI  LIV  1 Term 03/26/21 [redacted]w[redacted]d 01:54 / 00:06 2444 g F Vag-Spont EPI  LIV  History of gonorrhea in the past, no other STIs. 10/21/22 LGSIL pap.  Patient denies any other pertinent gynecologic issues.   No current facility-administered medications on file prior to encounter.   Current Outpatient Medications on File Prior to Encounter  Medication Sig Dispense Refill   ondansetron  (ZOFRAN -ODT) 4 MG disintegrating tablet Take 1 tablet (4 mg total) by mouth every 8 (eight) hours as needed for nausea or vomiting. 15 tablet 0   Allergies  Allergen Reactions   No Known Allergies Other (See Comments)    Social History:   reports that she quit smoking about 3 years ago. Her smoking use included cigarettes. She has never used smokeless tobacco. She  reports that she does not drink alcohol and does not use drugs. Family History  Problem Relation Age of Onset   Hypertension Mother    Healthy Father    Cancer Paternal Grandmother     Review of Systems: Pertinent items noted in HPI and remainder of comprehensive ROS otherwise negative.  PHYSICAL EXAM: Blood pressure 116/63, pulse 75, temperature 98.3 F (36.8 C), temperature source Oral, resp. rate 16, height 5' 4 (1.626 m), weight 78.5 kg, last menstrual period 08/26/2023, not currently breastfeeding. CONSTITUTIONAL: Well-developed, well-nourished female in no acute distress.  HENT:  Normocephalic, atraumatic, External right and left ear normal.  EYES: Conjunctivae and EOM are normal. Pupils are equal, round, and reactive to light. No scleral icterus.  NECK: Normal range of motion, supple, no masses SKIN: Skin is warm and dry. No rash noted. Not diaphoretic. No erythema. No pallor. NEUROLOGIC: Alert and oriented to person, place, and time. Normal reflexes, muscle tone coordination. No cranial nerve deficit noted. PSYCHIATRIC: Normal mood and affect. Normal behavior. Normal judgment and thought content. CARDIOVASCULAR: Normal heart rate noted, regular rhythm RESPIRATORY: Effort and breath sounds normal, no problems with respiration noted ABDOMEN: Soft, nontender, nondistended. PELVIC: Deferred MUSCULOSKELETAL: Normal range of motion. No tenderness.  No cyanosis, clubbing, or edema.  2+ distal pulses.  Labs: No results found for this or any previous visit (from the past 24 hours).  Imaging Studies: No  results found.  Assessment: Principal Problem:   Missed abortion   Plan: Admit to [facility] Discussed misoprostol  induction of labor plan with patient, all questions answered. Discussed risk of needing Dilation and Evacuation for concerning bleeding/retained placenta. Misoprostol  800 mcg pv q4h ordered; analgesic meds also ordered as needed Last hemoglobin was 11.5 on 12/28/23 and  her blood type is B+; admission labs and Type and Screen ordered Appropriate support given to her and family, TOC and Chaplain services consulted Routine floor care    GLORIS HUGGER, MD, FACOG Obstetrician & Gynecologist, [facility] for [employer], [facility]

## 2023-12-31 NOTE — Progress Notes (Addendum)
   GYNECOLOGY PROGRESS NOTE  History:  23 y.o. [id number] presents to [facility] for ultrasound follow up. Had new ob visit 9/15 with noted cardiac activity. AFP came back positive, ordered with incorrect dates, scheduled for dating ultrasound to assess dates. Denies pain or bleeding   Health Maintenance Due  Topic Date Due   Influenza Vaccine  11/13/2023   COVID-19 Vaccine (1 - 2024-25 season) Never done     Review of Systems:  Pertinent items are noted in HPI.   Objective:  Physical Exam Last menstrual period 08/26/2023, not currently breastfeeding. VS reviewed, nursing note reviewed,  Constitutional: well developed, well nourished, no distress Pulm/chest wall: normal effort Neuro: alert and oriented  Skin: warm, dry Psych: appropriate for situation Pelvic exam:deferred  Assessment & Plan:  1. IUFD at less than 20 weeks of gestation (Primary) Expressed condolences  Discussed dating and afp, by LMP [redacted]w[redacted]d, measuring [redacted]w[redacted]d, with no cardiac activity noted  Reviewed common reason  presumed to be genetic abnormalities that allow a pregnancy to start but not continue past an early stage, but realistically we do not know the cause in most cases. Reviewed options of being admitted to [facility] for medication and delivery vs D&E that was discussed with Dr. Jayne. She is going to go home and talk with her mom and let me know what she decides  Told her to take all the time that she needs     Nidia Daring, FNP

## 2023-12-31 NOTE — Progress Notes (Signed)
 Came to check on patient, she reports having mild cramping and small amount of bleeding  Blood pressure (!) 118/57, pulse 70, temperature 98.4 F (36.9 C), temperature source Oral, resp. rate 17, height 5' 4 (1.626 m), weight 78.5 kg, last menstrual period 08/26/2023, SpO2 100%, not currently breastfeeding.  Will continue misoprostol  induction as ordered. Hopeful for delivery soon.   Gloris Hugger, MD

## 2024-01-01 ENCOUNTER — Other Ambulatory Visit (HOSPITAL_COMMUNITY): Payer: Self-pay

## 2024-01-01 ENCOUNTER — Encounter (HOSPITAL_COMMUNITY): Payer: Self-pay | Admitting: Obstetrics & Gynecology

## 2024-01-01 DIAGNOSIS — O021 Missed abortion: Secondary | ICD-10-CM | POA: Diagnosis not present

## 2024-01-01 MED ORDER — IBUPROFEN 600 MG PO TABS
600.0000 mg | ORAL_TABLET | Freq: Four times a day (QID) | ORAL | 0 refills | Status: AC | PRN
  Filled 2024-01-01: qty 30, 8d supply, fill #0

## 2024-01-01 MED ORDER — DOXYCYCLINE HYCLATE 100 MG PO TABS
200.0000 mg | ORAL_TABLET | Freq: Once | ORAL | Status: AC
  Administered 2024-01-01: 200 mg via ORAL
  Filled 2024-01-01: qty 2

## 2024-01-01 MED ORDER — KETOROLAC TROMETHAMINE 30 MG/ML IJ SOLN
30.0000 mg | Freq: Four times a day (QID) | INTRAMUSCULAR | Status: DC
  Administered 2024-01-01: 30 mg via INTRAVENOUS
  Filled 2024-01-01: qty 1

## 2024-01-01 MED ORDER — IBUPROFEN 600 MG PO TABS: 600.0000 mg | ORAL_TABLET | Freq: Four times a day (QID) | ORAL | Status: DC | PRN

## 2024-01-01 NOTE — Plan of Care (Signed)
  Problem: Education: Goal: Knowledge of General Education information will improve Description: Including pain rating scale, medication(s)/side effects and non-pharmacologic comfort measures Outcome: Adequate for Discharge   Problem: Health Behavior/Discharge Planning: Goal: Ability to manage health-related needs will improve Outcome: Adequate for Discharge   Problem: Clinical Measurements: Goal: Ability to maintain clinical measurements within normal limits will improve Outcome: Adequate for Discharge Goal: Will remain free from infection Outcome: Adequate for Discharge Goal: Diagnostic test results will improve Outcome: Adequate for Discharge Goal: Respiratory complications will improve Outcome: Adequate for Discharge Goal: Cardiovascular complication will be avoided Outcome: Adequate for Discharge   Problem: Activity: Goal: Risk for activity intolerance will decrease Outcome: Adequate for Discharge   Problem: Nutrition: Goal: Adequate nutrition will be maintained Outcome: Adequate for Discharge   Problem: Coping: Goal: Level of anxiety will decrease Outcome: Adequate for Discharge   Problem: Elimination: Goal: Will not experience complications related to bowel motility Outcome: Adequate for Discharge Goal: Will not experience complications related to urinary retention Outcome: Adequate for Discharge   Problem: Pain Managment: Goal: General experience of comfort will improve and/or be controlled Outcome: Adequate for Discharge   Problem: Safety: Goal: Ability to remain free from injury will improve Outcome: Adequate for Discharge   Problem: Skin Integrity: Goal: Risk for impaired skin integrity will decrease Outcome: Adequate for Discharge   Problem: Education: Goal: Knowledge of General Education information will improve Description: Including pain rating scale, medication(s)/side effects and non-pharmacologic comfort measures Outcome: Adequate for  Discharge   Problem: Health Behavior/Discharge Planning: Goal: Ability to manage health-related needs will improve Outcome: Adequate for Discharge   Problem: Clinical Measurements: Goal: Ability to maintain clinical measurements within normal limits will improve Outcome: Adequate for Discharge Goal: Will remain free from infection Outcome: Adequate for Discharge Goal: Diagnostic test results will improve Outcome: Adequate for Discharge Goal: Respiratory complications will improve Outcome: Adequate for Discharge Goal: Cardiovascular complication will be avoided Outcome: Adequate for Discharge   Problem: Activity: Goal: Risk for activity intolerance will decrease Outcome: Adequate for Discharge   Problem: Nutrition: Goal: Adequate nutrition will be maintained Outcome: Adequate for Discharge   Problem: Coping: Goal: Level of anxiety will decrease Outcome: Adequate for Discharge   Problem: Elimination: Goal: Will not experience complications related to bowel motility Outcome: Adequate for Discharge Goal: Will not experience complications related to urinary retention Outcome: Adequate for Discharge   Problem: Pain Managment: Goal: General experience of comfort will improve and/or be controlled Outcome: Adequate for Discharge   Problem: Safety: Goal: Ability to remain free from injury will improve Outcome: Adequate for Discharge   Problem: Skin Integrity: Goal: Risk for impaired skin integrity will decrease Outcome: Adequate for Discharge   Problem: Education: Goal: Knowledge of disease or condition will improve Outcome: Adequate for Discharge Goal: Knowledge of the prescribed therapeutic regimen will improve Outcome: Adequate for Discharge Goal: Individualized Educational Video(s) Outcome: Adequate for Discharge   Problem: Clinical Measurements: Goal: Complications related to the disease process, condition or treatment will be avoided or minimized Outcome:  Adequate for Discharge

## 2024-01-01 NOTE — Progress Notes (Addendum)
 Called to evaluate with more bulging bag of membranes noted outside vagina. Patient reports mild pressure, minimal pain in abdomen  Blood pressure (!) 120/51, pulse 68, temperature 98.3 F (36.8 C), temperature source Oral, resp. rate 16, height 5' 4 (1.626 m), weight 78.5 kg, last menstrual period 08/26/2023, SpO2 100%, not currently breastfeeding.  Pelvic exam showed bulging bag of membranes contained deceased fetus, RN present as chaperone. Patient was asked to push, and she delivered the entire sac and the attached placenta at 0401.  Minimal bleeding noted.  Placenta noted to be intact.  EBL around 100 ml. Sterile digital exam done, no membranes felt in vagina or in cervix/lower uterine segment. Patient tolerated delivery well.  Patient will be given Doxycycline  200 mg po x 1 for infection prevention. Analgesia also ordered for patient as needed Support given to her and her mother who is at bedside. Will monitor her after delivery closely.  Patient was given the choice to go home later today or tomorrow, she will let us  know what she decides to do. She was also asked if she wanted genetic analysis done, she agreed to this.  ANORA test ordered, specimens will be collected and sent accordingly.  For now, will continue routine post delivery care and provide support for her and her family.   GLORIS HUGGER, MD, FACOG Obstetrician & Gynecologist, [facility] for [employer], [facility]

## 2024-01-01 NOTE — Progress Notes (Signed)
CSW received consult due to IUFD.  CSW available for support secondary to Spiritual Care Services and will await call from Chaplain before becoming involved.  CSW screening out referral at this time.  Javone Ybanez, LCSW Clinical Social Worker [facility] Cell#: [phone]  

## 2024-01-01 NOTE — Discharge Summary (Signed)
 Physician Discharge Summary  Patient ID: Stacey Woods MRN: [id number] DOB/AGE: Aug 30, 2000 23 y.o.  Admit date: 12/31/2023 Discharge date: 01/01/2024  Admission Diagnoses: Fetal loss in the 2nd trimester Abnormal AFP  Discharge Diagnoses:  Principal Problem:   Missed abortion   Discharged Condition: good  Hospital Course: admitted for medical management of 15 weeks size fetal loss Received cytotec  x 3 doses with successful delivery including placenta Unremarkable post delivery course  Consults:   Significant Diagnostic Studies:   Treatments: cytotec  to effect delivery  Discharge Exam: Blood pressure 116/65, pulse 76, temperature 97.7 F (36.5 C), temperature source Axillary, resp. rate 17, height 5' 4 (1.626 m), weight 78.5 kg, last menstrual period 08/26/2023, SpO2 100%, not currently breastfeeding. General appearance: alert, cooperative, and no distress GI: soft, non-tender; bowel sounds normal; no masses,  no organomegaly  Disposition: Discharge disposition: 01-Home or Self Care       Discharge Instructions     Diet - low sodium heart healthy   Complete by: As directed    Increase activity slowly   Complete by: As directed    Sexual Activity Restrictions   Complete by: As directed    No sex 6 weeks      Allergies as of 01/01/2024       Reactions   No Known Allergies Other (See Comments)        Medication List     TAKE these medications    ibuprofen  600 MG tablet Commonly known as: ADVIL  Take 1 tablet (600 mg total) by mouth every 6 (six) hours as needed for fever, headache or cramping.   ondansetron  4 MG disintegrating tablet Commonly known as: ZOFRAN -ODT Take 1 tablet (4 mg total) by mouth every 8 (eight) hours as needed for nausea or vomiting.        Follow-up Information     [facility] Follow up in 2 week(s).   Specialty: Obstetrics and Gynecology Why: post op visit Contact  information: [address] [place]  [address] [phone]                Signed: Vonn VEAR Inch 01/01/2024, 4:35 PM

## 2024-01-01 NOTE — Progress Notes (Signed)
 Patient with bulging bag of membranes as noted by RN, unsure of there are fetal parts.  Patient does not feel any pressure to push. Minimal bleeding noted.  Blood pressure (!) 120/51, pulse 68, temperature 98.3 F (36.8 C), temperature source Oral, resp. rate 16, height 5' 4 (1.626 m), weight 78.5 kg, last menstrual period 08/26/2023, SpO2 100%, not currently breastfeeding.  Will continue misoprostol  induction for now Clear liquids only as needed Hopeful for delivery soon OR staff aware of need for possible D&E for heavy bleeding/retained placenta.   GLORIS HUGGER, MD, FACOG Obstetrician & Gynecologist, [facility] for [employer], [facility]

## 2024-01-02 ENCOUNTER — Telehealth (HOSPITAL_COMMUNITY): Payer: Self-pay | Admitting: *Deleted

## 2024-01-04 LAB — PANORAMA PRENATAL TEST FULL PANEL:PANORAMA TEST PLUS 5 ADDITIONAL MICRODELETIONS: FETAL FRACTION: 11

## 2024-01-05 LAB — SURGICAL PATHOLOGY

## 2024-01-07 LAB — HORIZON CUSTOM: REPORT SUMMARY: NEGATIVE

## 2024-01-07 LAB — ANORA MISCARRIAGE TEST - FRESH

## 2024-01-08 ENCOUNTER — Ambulatory Visit: Payer: Self-pay | Admitting: Obstetrics & Gynecology

## 2024-01-25 ENCOUNTER — Ambulatory Visit: Admitting: Obstetrics and Gynecology

## 2024-01-26 ENCOUNTER — Other Ambulatory Visit: Payer: Self-pay

## 2024-01-26 ENCOUNTER — Ambulatory Visit: Admitting: Obstetrics and Gynecology

## 2024-01-26 VITALS — BP 121/67 | HR 75 | Wt 178.0 lb

## 2024-01-26 DIAGNOSIS — Z3A15 15 weeks gestation of pregnancy: Secondary | ICD-10-CM | POA: Diagnosis not present

## 2024-01-26 DIAGNOSIS — R87612 Low grade squamous intraepithelial lesion on cytologic smear of cervix (LGSIL): Secondary | ICD-10-CM

## 2024-01-26 DIAGNOSIS — O021 Missed abortion: Secondary | ICD-10-CM | POA: Diagnosis not present

## 2024-01-26 NOTE — Progress Notes (Signed)
 Obstetrics and Gynecology Visit Return Patient Evaluation  Appointment Date: 01/26/2024  Primary Care Provider: Patient, No Pcp Per  OBGYN Clinic: [facility]  Chief Complaint: Postpartum visit  History of Present Illness:  Stacey Woods is a 23 y.o. [id number] s/p 9/19 SVD after IOL for IUFD. Patient went to her new OB appt on 9/18 and should've been 18/1. No FHTs and u/s confirmed IUFD with u/s measuring the baby at around 15 weeks. She had an uncomplicated cytotec  IOL and delivery.   Interval History: Since that time, she states that she has no problems or issues. New OB labs negative and Anora showed normal female  Review of Systems: as noted in the History of Present Illness.  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  Physical Exam:  BP 121/67   Pulse 75   Wt 178 lb (80.7 kg)   BMI 30.55 kg/m  Body mass index is 30.55 kg/m. General appearance: Well nourished, well developed female in no acute distress.  Neuro/Psych:  Normal mood and affect.    Assessment: patient stable  Plan:  1. IUFD at less than 20 weeks of gestation (Primary) D/w her re: anora and normal placenta. Low yield but I feel doing a limited thrombophilia w/u is reasonable and pt would like to do this next visit: tsh, ft4, lupus anticoag, beta2 glycoprotein and anti-cardiolipin  2. LGSIL on Pap smear of cervix Pt aware and I told her I'd just repeat a pap smear when we see her back since the LSIL was from July last year  Return in about 6 weeks (around 03/08/2024) for annual , md or app.   Bebe Izell Raddle MD Attending Center for [employer] [facility])

## 2024-01-29 ENCOUNTER — Other Ambulatory Visit

## 2024-04-28 ENCOUNTER — Other Ambulatory Visit (HOSPITAL_COMMUNITY)
Admission: RE | Admit: 2024-04-28 | Discharge: 2024-04-28 | Disposition: A | Source: Ambulatory Visit | Attending: Obstetrics and Gynecology | Admitting: Obstetrics and Gynecology

## 2024-04-28 ENCOUNTER — Ambulatory Visit: Admitting: Obstetrics and Gynecology

## 2024-04-28 ENCOUNTER — Encounter: Payer: Self-pay | Admitting: Obstetrics and Gynecology

## 2024-04-28 ENCOUNTER — Other Ambulatory Visit: Payer: Self-pay

## 2024-04-28 VITALS — BP 117/74 | HR 61 | Wt 178.0 lb

## 2024-04-28 DIAGNOSIS — R87612 Low grade squamous intraepithelial lesion on cytologic smear of cervix (LGSIL): Secondary | ICD-10-CM

## 2024-04-28 DIAGNOSIS — Z113 Encounter for screening for infections with a predominantly sexual mode of transmission: Secondary | ICD-10-CM

## 2024-04-28 DIAGNOSIS — Z8759 Personal history of other complications of pregnancy, childbirth and the puerperium: Secondary | ICD-10-CM

## 2024-04-28 DIAGNOSIS — Z01419 Encounter for gynecological examination (general) (routine) without abnormal findings: Secondary | ICD-10-CM

## 2024-04-28 NOTE — Progress Notes (Signed)
 Obstetrics and Gynecology Annual Patient Evaluation  Appointment Date: 04/28/2024  OBGYN Clinic: Center for [facility]  Chief Complaint:  Chief Complaint  Patient presents with   Annual Exam    History of Present Illness: Stacey Woods is a 24 y.o.  [phone] (Patient's last menstrual period was 03/26/2024 (within days).), seen for the above chief complaint.   Periods are back to qmonth, regular after the recent mid second trimester loss, albeit a little more painful than before that pregnancy.   Review of Systems: Pertinent items are noted in HPI.   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    Past Medical History:  Past Medical History:  Diagnosis Date   Anemia    Gonorrhea 09/11/2020   Turf toe 10/28/2019   Past Surgical History:  Past Surgical History:  Procedure Laterality Date   SESAMOIDECTOMY Right 11/10/2019   Procedure: Right plantar plate repair;  Surgeon: Kit Rush, MD;  Location: [facility];  Service: Orthopedics;  Laterality: Right;   Past Obstetrical History:  OB History  Gravida Para Term Preterm AB Living  3 2 2  0 1 2  SAB IAB Ectopic Multiple Live Births  1 0 0 0 2    # Outcome Date GA Lbr Len/2nd Weight Sex Type Anes PTL Lv  3 SAB 01/01/24 [redacted]w[redacted]d       FD  2 Term 02/17/23 [redacted]w[redacted]d 04:06 / 00:07 5 lb 13.5 oz (2.65 kg) M Vag-Spont EPI  LIV  1 Term 03/26/21 [redacted]w[redacted]d 01:54 / 00:06 5 lb 6.2 oz (2.444 kg) F Vag-Spont EPI  LIV   Past Gynecological History: As per HPI. History of Pap Smear(s): Yes.   Last pap 10/2022, which was LSIL She is currently using condoms for contraception.   Social History:  Social History   Socioeconomic History   Marital status: Single    Spouse name: Not on file   Number of children: Not on file   Years of education: Not on file   Highest education level: Not on file   Occupational History   Occupation: [employer]  Tobacco Use   Smoking status: Former    Current packs/day: 0.00    Types: Cigarettes    Quit date: 08/28/2020    Years since quitting: 3.6   Smokeless tobacco: Never   Tobacco comments:    Before pregnancy, black & milds  Vaping Use   Vaping status: Never Used  Substance and Sexual Activity   Alcohol use: Never   Drug use: Never   Sexual activity: Yes    Birth control/protection: None  Other Topics Concern   Not on file  Social History Narrative   Not on file   Social Drivers of Health   Tobacco Use: Medium Risk (01/01/2024)   Patient History    Smoking Tobacco Use: Former    Smokeless Tobacco Use: Never    Passive Exposure: Not on Actuary Strain: Not on file  Food Insecurity: No Food Insecurity (01/26/2024)   Epic    Worried About Programme Researcher, Broadcasting/film/video in the Last Year: Never true    Ran Out of Food in the Last Year: Never true  Transportation Needs: No Transportation Needs (12/31/2023)   Epic    Lack of Transportation (Medical): No    Lack of Transportation (Non-Medical): No  Physical Activity: Not on file  Stress: Not on file  Social Connections: Not on file  Intimate Partner Violence: Not At Risk (12/31/2023)   Epic    Fear of Current or Ex-Partner: No    Emotionally Abused: No    Physically Abused: No    Sexually Abused: No  Depression (PHQ2-9): Low Risk (01/26/2024)   Depression (PHQ2-9)    PHQ-2 Score: 4  Alcohol Screen: Not on file  Housing: Low Risk (12/31/2023)   Epic    Unable to Pay for Housing in the Last Year: No    Number of Times Moved in the Last Year: 0    Homeless in the Last Year: No  Utilities: Not At Risk (12/31/2023)   Epic    Threatened with loss of utilities: No  Health Literacy: Not on file   Family History:  Family History  Problem Relation Age of Onset   Hypertension Mother    Healthy Father    Cancer Paternal Grandmother     Medications [facility] had no  medications administered during this visit. Current Outpatient Medications  Medication Sig Dispense Refill   ibuprofen  (ADVIL ) 600 MG tablet Take 1 tablet (600 mg total) by mouth every 6 (six) hours as needed for fever, headache or cramping. 30 tablet 0   ondansetron  (ZOFRAN -ODT) 4 MG disintegrating tablet Take 1 tablet (4 mg total) by mouth every 8 (eight) hours as needed for nausea or vomiting. (Patient not taking: Reported on 04/28/2024) 15 tablet 0   No current facility-administered medications for this visit.   Allergies No known allergies  Physical Exam:  BP 117/74   Pulse 61   Wt 178 lb (80.7 kg)   LMP 03/26/2024 (Within Days)   BMI 30.55 kg/m  Body mass index is 30.55 kg/m. RN chaperone for exams General appearance: Well nourished, well developed female in no acute distress.  Neck:  Supple, normal appearance, and no thyromegaly  Cardiovascular: normal s1 and s2.  No murmurs, rubs or gallops. Respiratory:  Clear to auscultation bilateral. Normal respiratory effort Abdomen: positive bowel sounds and no masses, hernias; diffusely non tender to palpation, non distended Breasts: breasts appear normal, no suspicious masses, no skin or nipple changes or axillary nodes. Neuro/Psych:  Normal mood and affect.  Skin:  Warm and dry.  Lymphatic:  No inguinal lymphadenopathy.  Pelvic exam: is not limited by body habitus EGBUS: within normal limits Vagina: within normal limits and with no blood or discharge in the vault Cervix: normal appearing cervix without tenderness, discharge or lesions. Uterus:  nonenlarged and non tender Adnexa:  normal adnexa and no mass, fullness, tenderness Rectovaginal: deferred  Laboratory: none  Radiology: none  Assessment: patient stable  Plan:  1. 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 Screening for STD (sexually transmitted disease) - RPR+HBsAg+HCVAb+...  3. History of IUFD - Antiphospholipid syndrome eval,  bld - TSH Rfx on Abnormal to Free T4  4. Well woman exam with routine gynecological exam  Bebe Izell Raddle MD Attending Center for [facility])

## 2024-04-29 LAB — TSH RFX ON ABNORMAL TO FREE T4: TSH: 0.844 u[IU]/mL (ref 0.450–4.500)

## 2024-05-01 LAB — ANTIPHOSPHOLIPID SYNDROME EVAL, BLD
APTT PPP: 26.1 s (ref 22.9–30.2)
Anticardiolipin IgG: <9 GPL U/mL (ref 0–14)
Anticardiolipin IgM: 16 [MPL'U]/mL — ABNORMAL HIGH (ref 0–12)
Beta-2 Glyco 1 IgM: <9 GPI IgM units (ref 0–32)
Beta-2 Glyco I IgG: <9 GPI IgG units (ref 0–20)
Dilute Viper Venom Time: 34.8 s (ref 0.0–47.0)
Hexagonal Phase Phospholipid: 8 s (ref 0–11)
INR: 1 (ref 0.9–1.2)
PT: 10.5 s (ref 9.1–12.0)
Thrombin Time: 21 s (ref 0.0–23.0)

## 2024-05-01 LAB — RPR+HBSAG+HCVAB+...
HIV Screen 4th Generation wRfx: NONREACTIVE
Hep C Virus Ab: NONREACTIVE
Hepatitis B Surface Ag: NEGATIVE
RPR Ser Ql: NONREACTIVE

## 2024-05-01 LAB — COAG STUDIES INTERP REPORT

## 2024-05-05 ENCOUNTER — Ambulatory Visit: Payer: Self-pay | Admitting: Obstetrics and Gynecology

## 2024-05-10 LAB — CYTOLOGY - PAP
Chlamydia: NEGATIVE
Comment: NEGATIVE
Comment: NEGATIVE
Comment: NEGATIVE
Comment: NEGATIVE
Comment: NORMAL
Diagnosis: UNDETERMINED — AB
HPV 16: NEGATIVE
HPV 18 / 45: NEGATIVE
High risk HPV: POSITIVE — AB
Neisseria Gonorrhea: NEGATIVE
